# Patient Record
Sex: Female | Born: 1965 | Hispanic: Yes | Marital: Married | Smoking: Never smoker
Health system: Southern US, Community
[De-identification: ages and names within clinical notes are randomized; demographics above are authoritative.]

## PROBLEM LIST (undated history)

## (undated) DIAGNOSIS — E785 Hyperlipidemia, unspecified: Secondary | ICD-10-CM

## (undated) DIAGNOSIS — K5900 Constipation, unspecified: Secondary | ICD-10-CM

## (undated) DIAGNOSIS — N390 Urinary tract infection, site not specified: Secondary | ICD-10-CM

## (undated) DIAGNOSIS — K219 Gastro-esophageal reflux disease without esophagitis: Secondary | ICD-10-CM

## (undated) HISTORY — PX: COLONOSCOPY: SHX174

## (undated) HISTORY — PX: TUBAL LIGATION: SHX77

## (undated) HISTORY — PX: POLYPECTOMY: SHX149

## (undated) HISTORY — DX: Gastro-esophageal reflux disease without esophagitis: K21.9

## (undated) HISTORY — DX: Constipation, unspecified: K59.00

## (undated) HISTORY — DX: Hyperlipidemia, unspecified: E78.5

## (undated) HISTORY — DX: Urinary tract infection, site not specified: N39.0

---

## 2003-03-19 ENCOUNTER — Ambulatory Visit (HOSPITAL_COMMUNITY): Admission: RE | Admit: 2003-03-19 | Discharge: 2003-03-19 | Payer: Self-pay | Admitting: Internal Medicine

## 2005-02-22 ENCOUNTER — Inpatient Hospital Stay (HOSPITAL_COMMUNITY): Admission: AD | Admit: 2005-02-22 | Discharge: 2005-02-22 | Payer: Self-pay | Admitting: *Deleted

## 2005-03-02 ENCOUNTER — Ambulatory Visit: Payer: Self-pay | Admitting: Family Medicine

## 2005-03-04 ENCOUNTER — Inpatient Hospital Stay (HOSPITAL_COMMUNITY): Admission: AD | Admit: 2005-03-04 | Discharge: 2005-03-04 | Payer: Self-pay | Admitting: Obstetrics and Gynecology

## 2005-03-04 ENCOUNTER — Ambulatory Visit: Payer: Self-pay | Admitting: Obstetrics and Gynecology

## 2005-03-07 ENCOUNTER — Ambulatory Visit: Payer: Self-pay | Admitting: *Deleted

## 2005-03-15 ENCOUNTER — Ambulatory Visit: Payer: Self-pay | Admitting: Obstetrics & Gynecology

## 2005-04-11 ENCOUNTER — Ambulatory Visit: Payer: Self-pay | Admitting: *Deleted

## 2005-05-09 ENCOUNTER — Ambulatory Visit: Payer: Self-pay | Admitting: *Deleted

## 2005-05-16 ENCOUNTER — Ambulatory Visit: Payer: Self-pay | Admitting: *Deleted

## 2005-05-28 ENCOUNTER — Ambulatory Visit: Payer: Self-pay | Admitting: Certified Nurse Midwife

## 2005-05-28 ENCOUNTER — Inpatient Hospital Stay (HOSPITAL_COMMUNITY): Admission: AD | Admit: 2005-05-28 | Discharge: 2005-05-31 | Payer: Self-pay | Admitting: Family Medicine

## 2005-11-03 ENCOUNTER — Emergency Department (HOSPITAL_COMMUNITY): Admission: EM | Admit: 2005-11-03 | Discharge: 2005-11-03 | Payer: Self-pay | Admitting: Emergency Medicine

## 2006-02-14 ENCOUNTER — Ambulatory Visit: Payer: Self-pay | Admitting: Obstetrics & Gynecology

## 2006-02-28 ENCOUNTER — Ambulatory Visit (HOSPITAL_COMMUNITY): Admission: RE | Admit: 2006-02-28 | Discharge: 2006-02-28 | Payer: Self-pay | Admitting: *Deleted

## 2006-03-14 ENCOUNTER — Ambulatory Visit: Payer: Self-pay | Admitting: Obstetrics & Gynecology

## 2006-04-04 ENCOUNTER — Ambulatory Visit: Payer: Self-pay | Admitting: Obstetrics & Gynecology

## 2006-05-02 ENCOUNTER — Ambulatory Visit: Payer: Self-pay | Admitting: Obstetrics & Gynecology

## 2006-05-02 ENCOUNTER — Ambulatory Visit (HOSPITAL_COMMUNITY): Admission: RE | Admit: 2006-05-02 | Discharge: 2006-05-02 | Payer: Self-pay | Admitting: Family Medicine

## 2006-05-26 ENCOUNTER — Inpatient Hospital Stay (HOSPITAL_COMMUNITY): Admission: AD | Admit: 2006-05-26 | Discharge: 2006-05-28 | Payer: Self-pay | Admitting: Family Medicine

## 2006-05-26 ENCOUNTER — Ambulatory Visit: Payer: Self-pay | Admitting: Obstetrics & Gynecology

## 2006-05-28 ENCOUNTER — Encounter (INDEPENDENT_AMBULATORY_CARE_PROVIDER_SITE_OTHER): Payer: Self-pay | Admitting: *Deleted

## 2006-10-16 HISTORY — PX: TUBAL LIGATION: SHX77

## 2007-08-20 IMAGING — US US OB TRANSVAGINAL MODIFY
1 series · 14 of 28 positions shown · non-contrast
Comparison: none

CLINICAL DATA: Vomiting.  Patient with recent tubal ligation.  Positive pregnancy test.
 OBSTETRICAL ULTRASOUND <14 WKS AND TRANSVAGINAL OB US:
TECHNIQUE: Both transabdominal and transvaginal ultrasound examinations were performed for complete evaluation of the gestation as well as the maternal uterus, adnexal regions, and pelvic cul-de-sac.

[Series 1: unknown · 0.32mm/px · 14 of 36 slices shown]
[im 2/36]
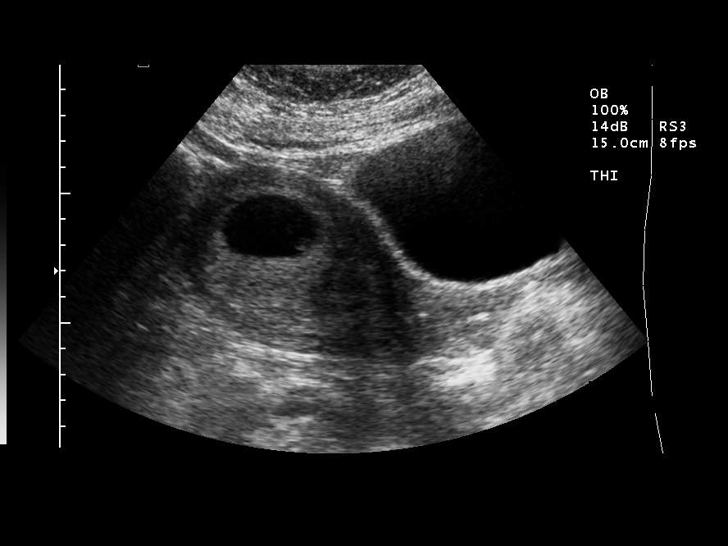
[im 4/36]
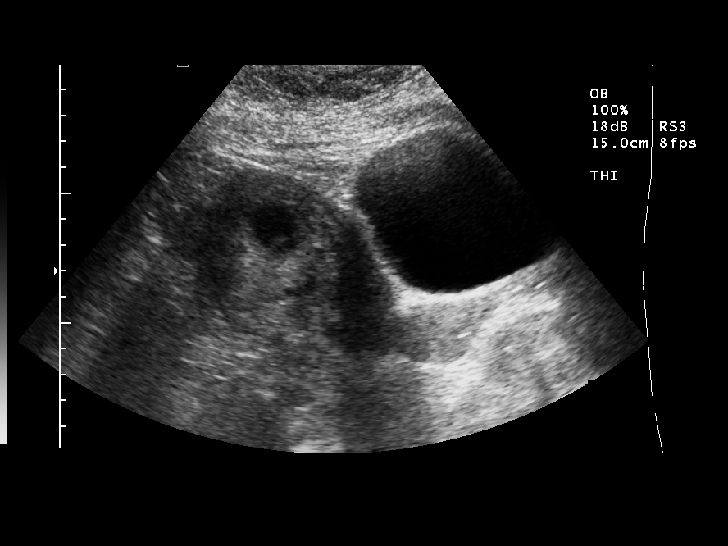
[im 7/36]
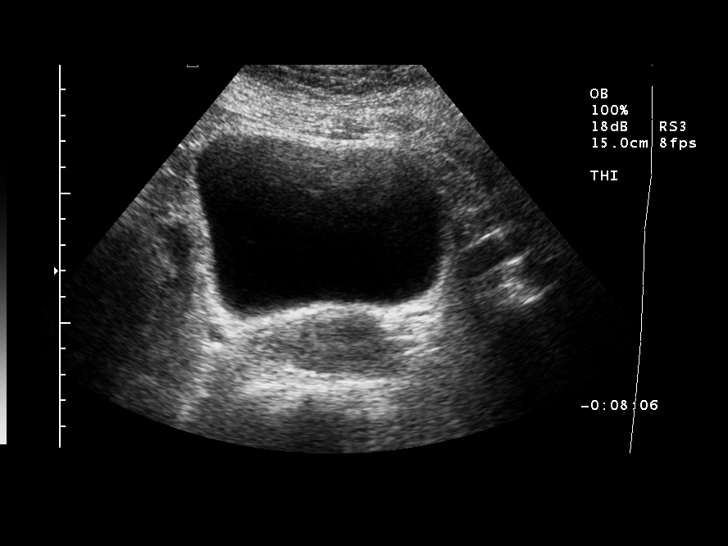
[im 10/36]
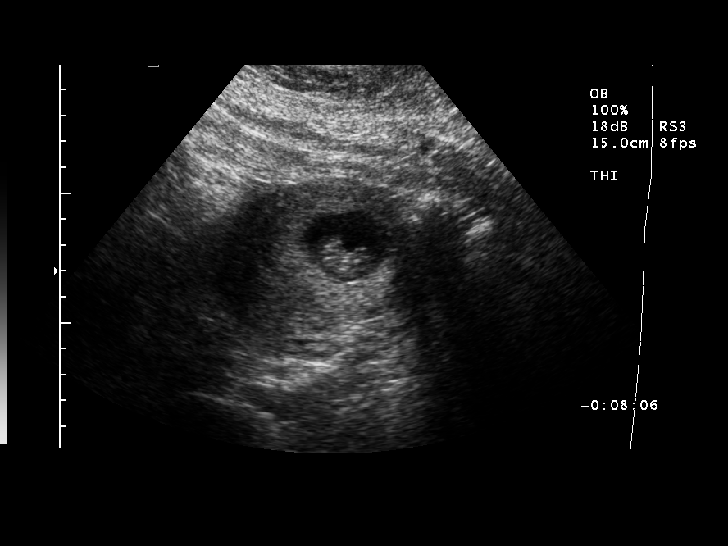
[im 12/36]
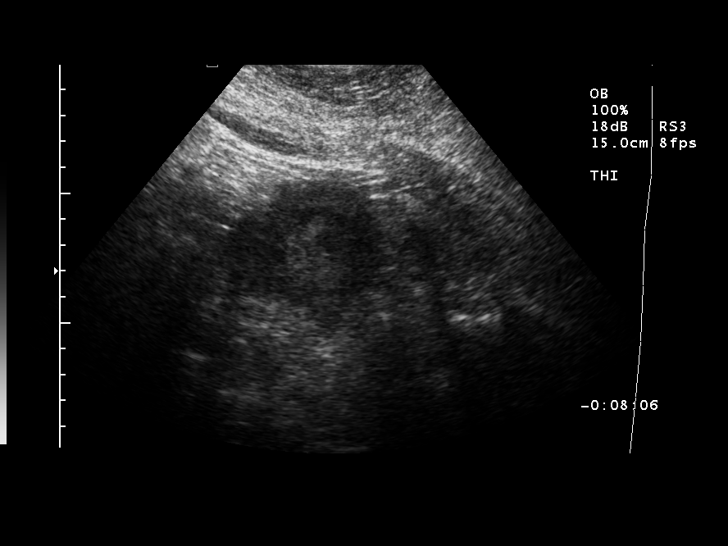
[im 15/36]
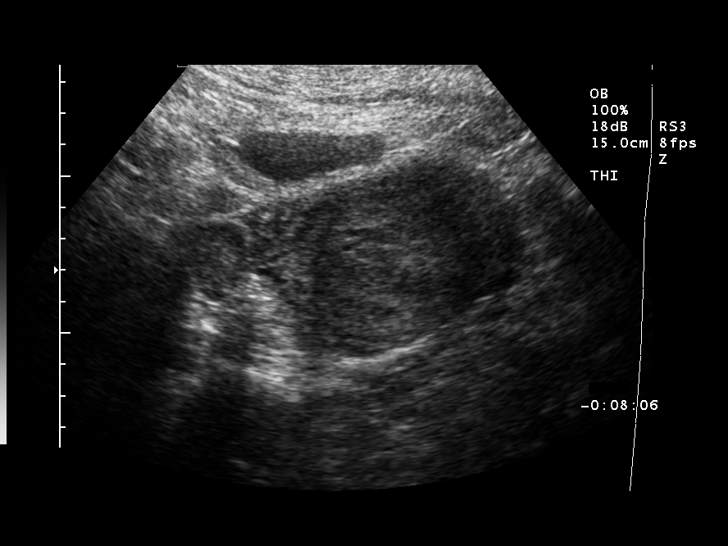
[im 17/36]
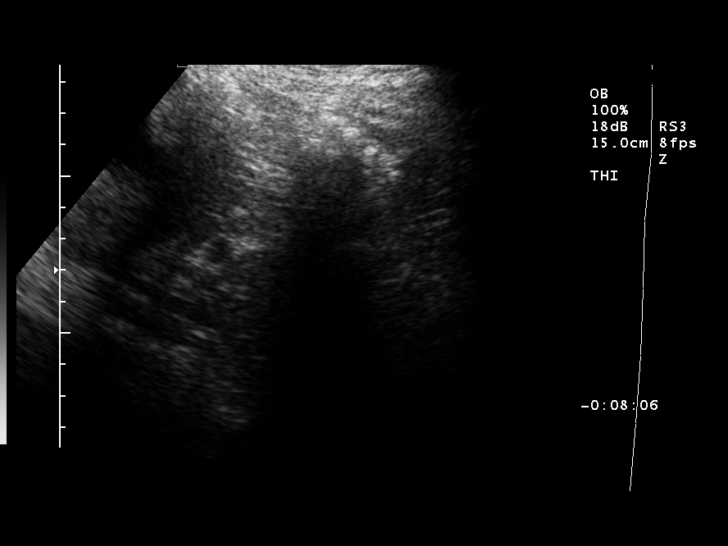
[im 20/36]
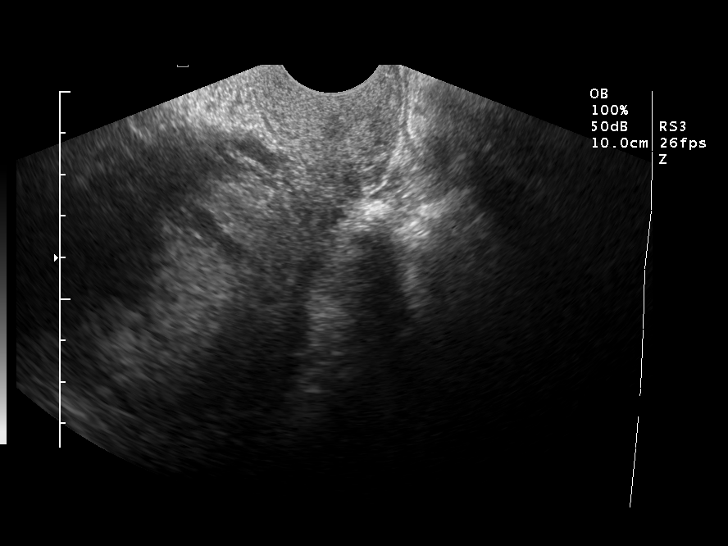
[im 23/36]
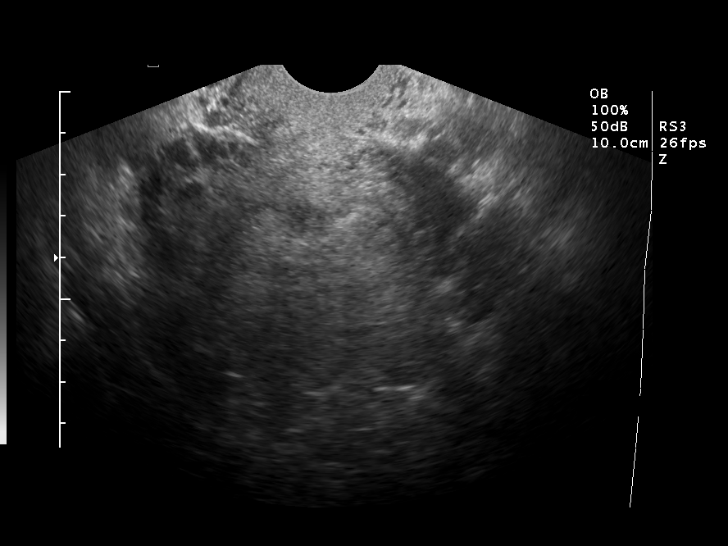
[im 25/36]
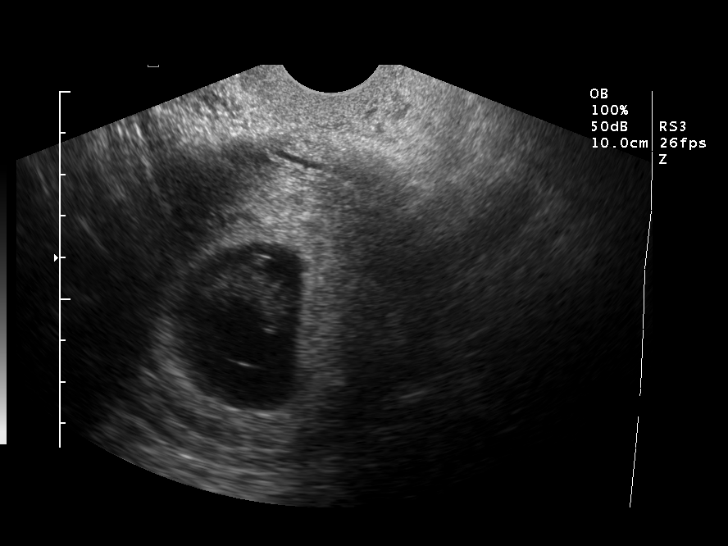
[im 28/36]
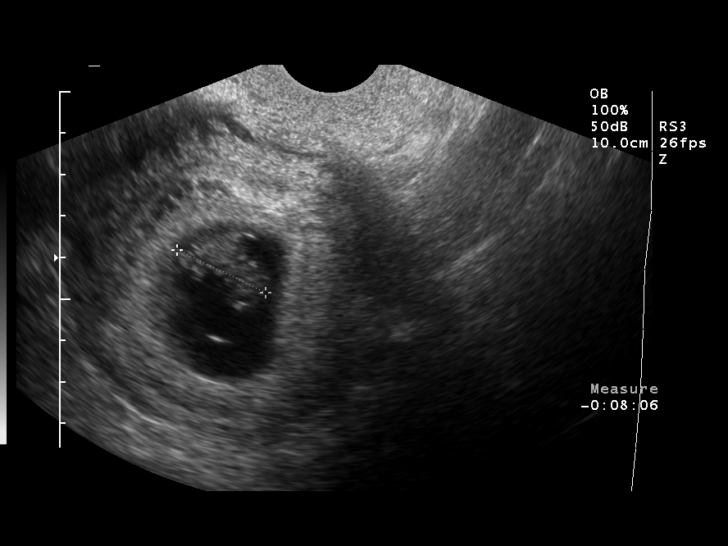
[im 30/36]
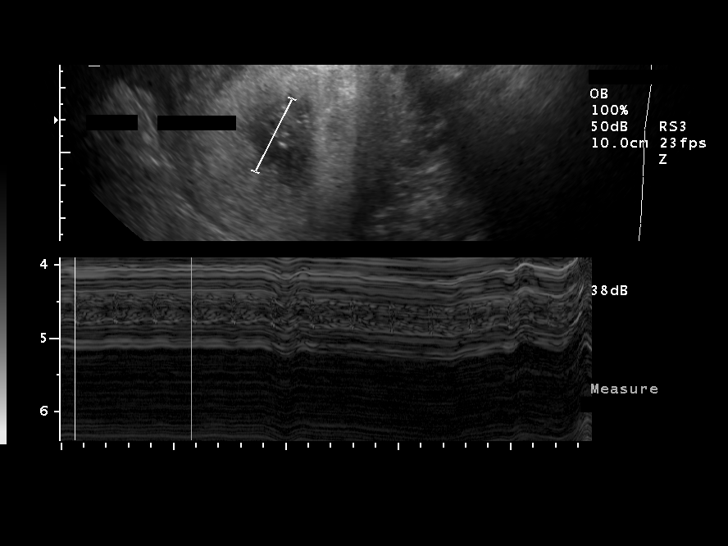
[im 33/36]
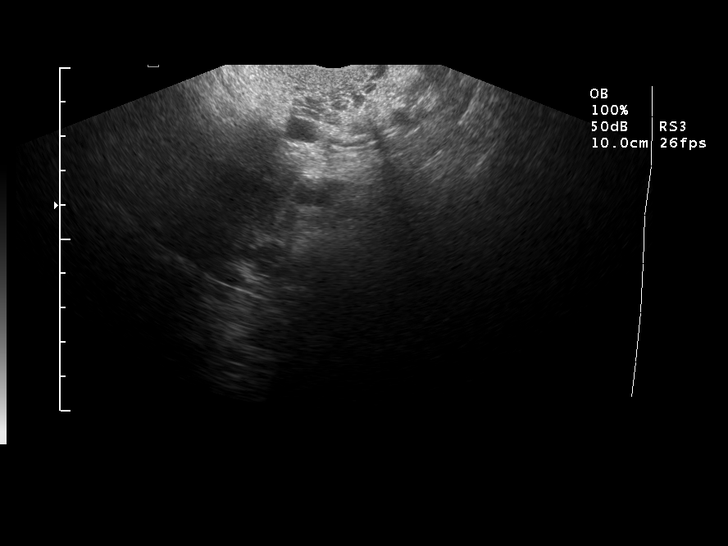
[im 36/36]
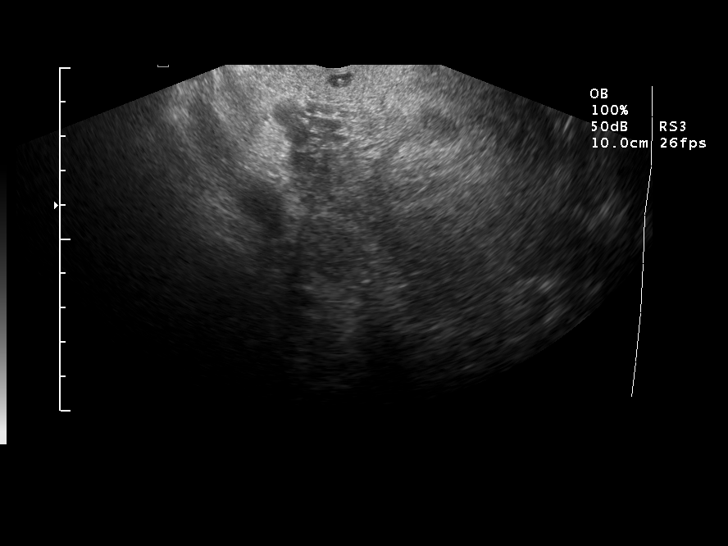

[14 of 28 positions shown; findings below may reference images not displayed]

FINDINGS: Intrauterine gestational sac is identified.  An amnion is apparent although the yolk sac is not well seen on the provided images.  Crown rump length is 23.6 mm which estimates a 9 week 0 day gestational age. Embryonic heart rate is measured at 174 bpm.  
 Neither ovary is well-visualized.  There is no free fluid identified in the cul-de-sac.
IMPRESSION: Single living intrauterine gestation at 9 weeks 0 days gestational age by crown rump length.

## 2007-12-15 IMAGING — US US OB DETAIL+14 WK
1 series · 13 of 28 positions shown · non-contrast
Comparison: none

CLINICAL DATA: Evaluate anatomy.  Advanced maternal age.

[Series 1: us ob detail+14 wk · 0.43mm/px · 13 of 95 slices shown]
[im 4/95]
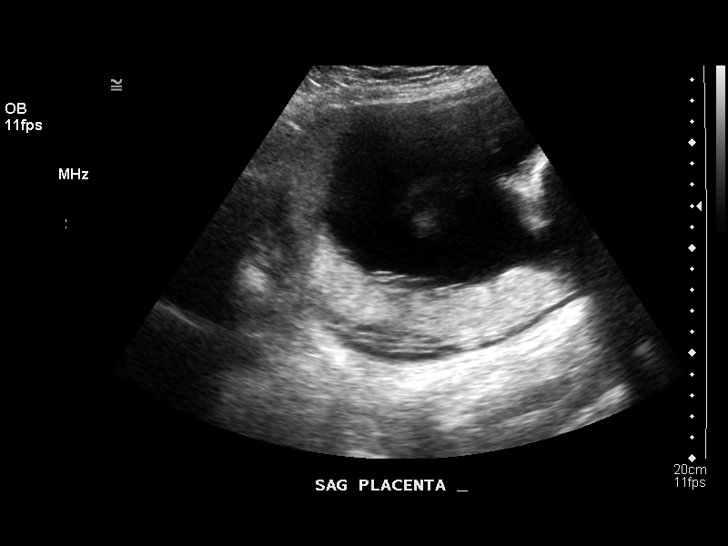
[im 11/95]
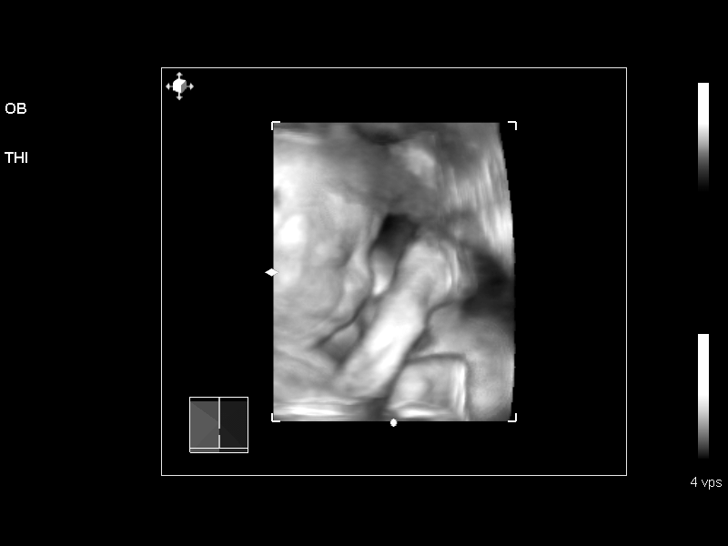
[im 18/95]
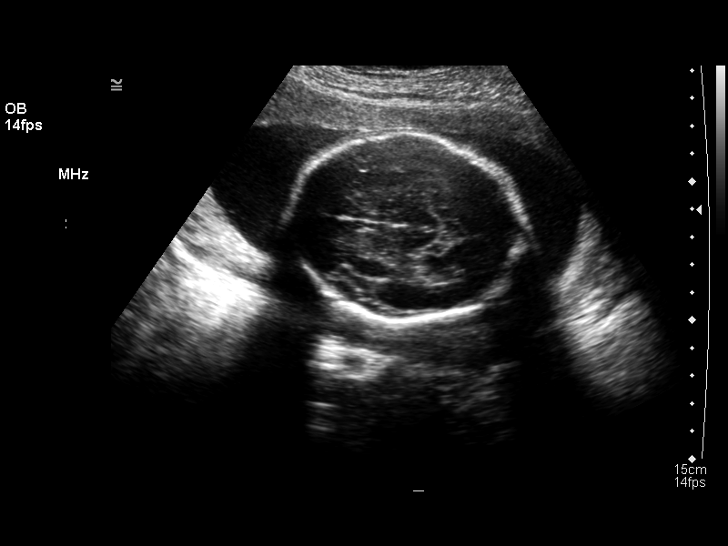
[im 25/95]
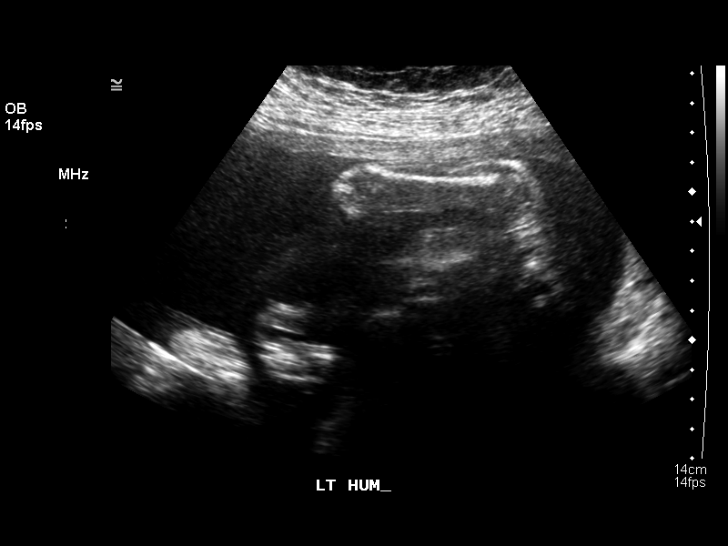
[im 32/95]
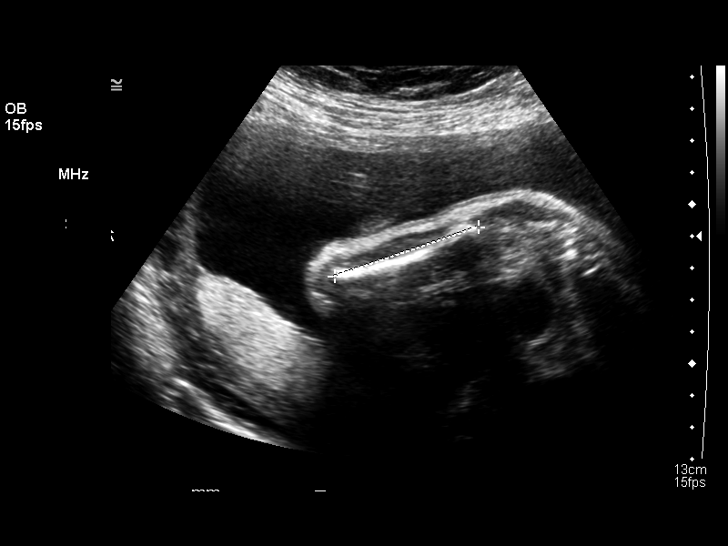
[im 39/95]
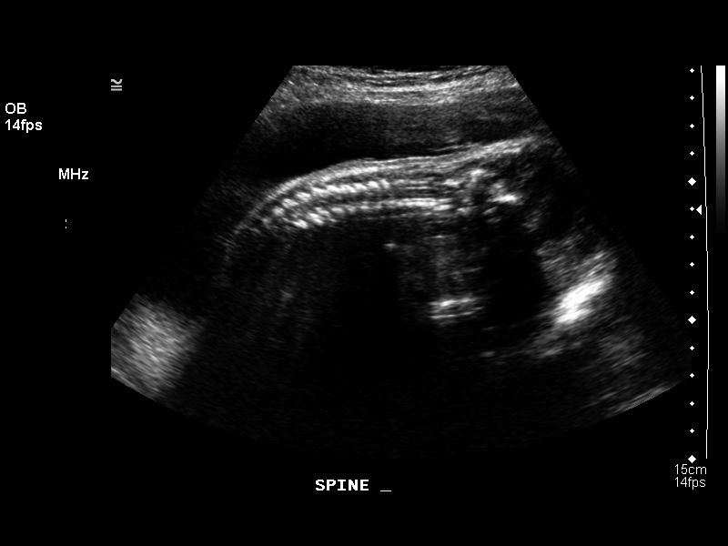
[im 49/95]
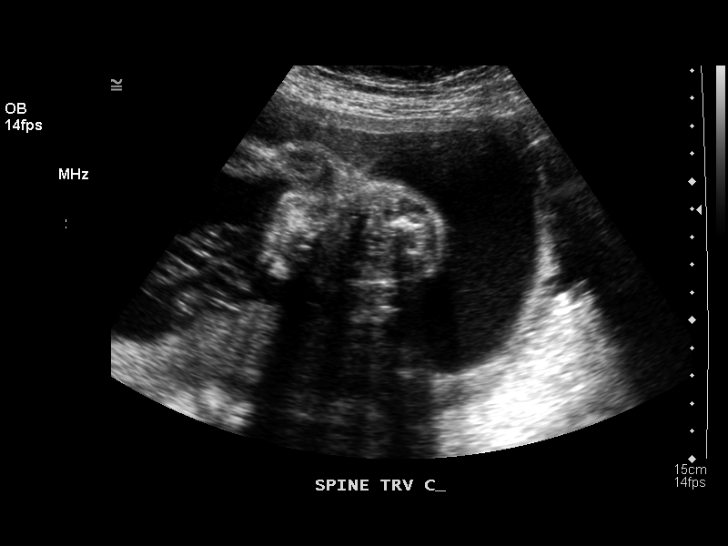
[im 56/95]
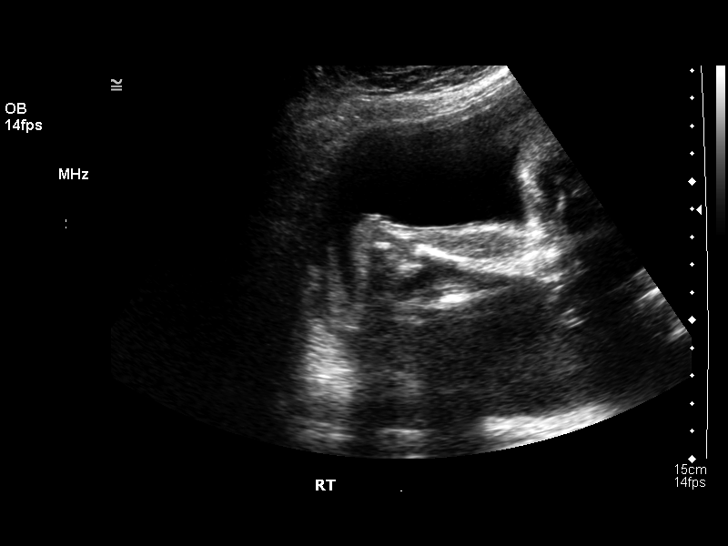
[im 63/95]
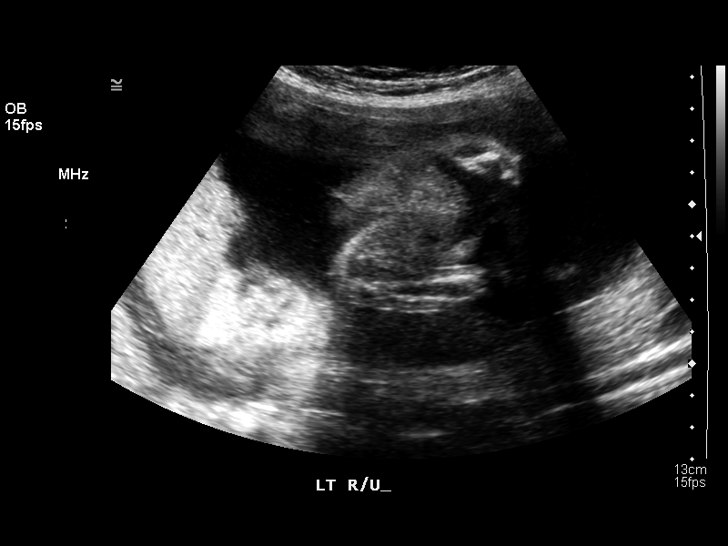
[im 70/95]
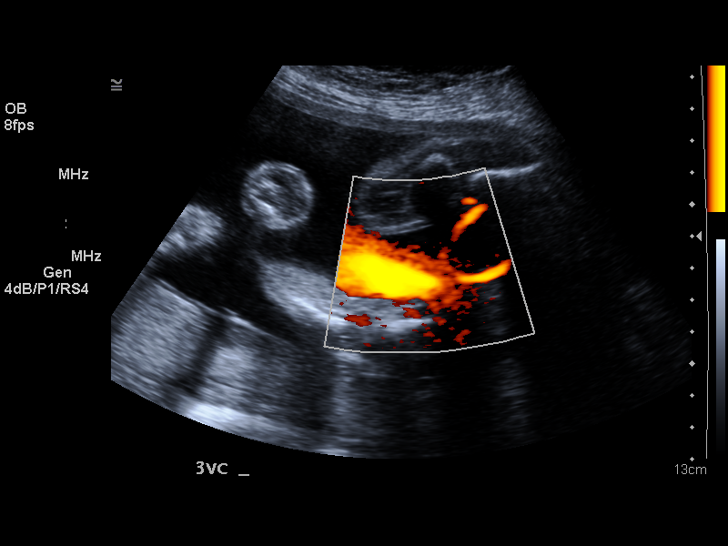
[im 77/95]
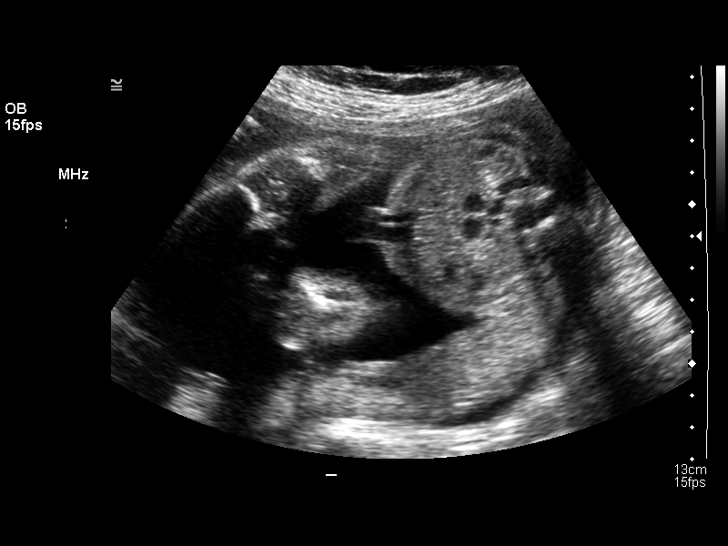
[im 84/95]
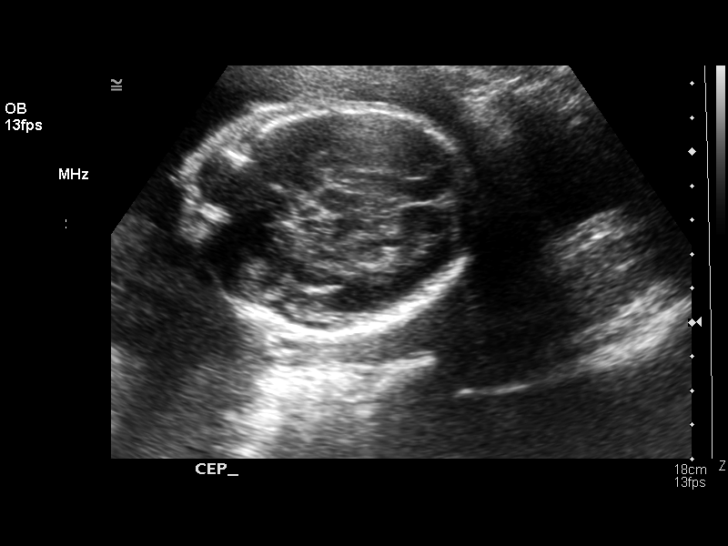
[im 91/95]
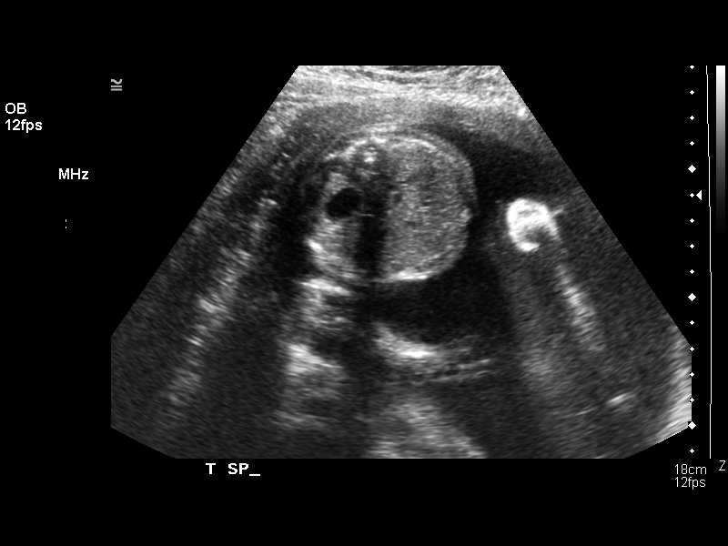

[13 of 28 positions shown; findings below may reference images not displayed]

DETAILED OBSTETRICAL ULTRASOUND:

 Number of Fetuses: 1
 Heart Rate:  141
 Movement:  Yes
 Breathing:  No
 Presentation:  Cephalic
 Placental Location:  Posterior, right lateral 
 Grade:  I
 Previa:  No
 Amniotic Fluid (Subjective):  Increased
 Amniotic Fluid (Objective):  22.7 cm AFI (5th -95th%ile = 9.7 – 22.3 cm for 26 wks)

 FETAL BIOMETRY
 BPD:  6.8 cm   27 w 1 d
 HC:  25.0 cm  27 w 1 d
 AC:  22.2 cm   26 w 4 d
 FL:   4.8 cm   26 w 1 d
 HL:   4.5 cm   26 w 4 d

 MEAN GA:  26 w 5 d  US EDC:  06/01/06
 Assigned GA:  26 w 2 d  Assigned EDC:  06/04/06
 EFW:  947 g (H)

 FETAL ANATOMY
 Lateral Ventricles:  Visualized 
 Thalami/CSP:  Visualized 
 Posterior Fossa:  Visualized 
 Nuchal Region:  N/A
 Spine:  Visualized 
 4 Chamber Heart on Left:  Not visualized 
 Stomach on Left:  Visualized 
 3 Vessel Cord:  Visualized 
 Cord Insertion Site:  Visualized 
 Kidneys:  Visualized 
 Bladder:  Visualized 
 Extremities:  Visualized 

 ADDITIONAL ANATOMY VISUALIZED:  LVOT, upper lip, orbits, profile, diaphragm, heel, 5th digit, ductal arch, aortic arch, and male genitalia.

 MATERNAL UTERINE AND ADNEXAL FINDINGS
 Cervix: 3.7 cm Transabdominally
IMPRESSION: 1.  Single living intrauterine fetus in cephalic presentation with subjectively increased amniotic fluid volume.  The AFI is 22.7 cm which is at the 95th percentile for a 26 week gestation.  
 2.  Estimated mean gestational age by ultrasound today is 26 weeks 5 days which is 3 days ahead of the reported assigned gestational age.  There is good concordance of the fetal biometric parameters. 
 3.  Visualized fetal anatomy is unremarkable although an adequate four chamber view of the heart and RVOT could not be obtained despite concerted effort.  Correlation with maternal triple screen results recommended.  Follow-up ultrasound in 2 to 3 weeks may be helpful to reassess cardiac anatomy as warranted.

## 2008-02-16 IMAGING — US US OB FOLLOW-UP
1 series · 13 of 28 positions shown · non-contrast
Comparison: none

CLINICAL DATA: Follow-up cardiac anatomy.  Initial ultrasound exam was performed at approximately 26 weeks.  The patient now returns at 35 weeks.

[Series 1: us ob follow-up · 0.35mm/px · 13 of 35 slices shown]
[im 2/35]
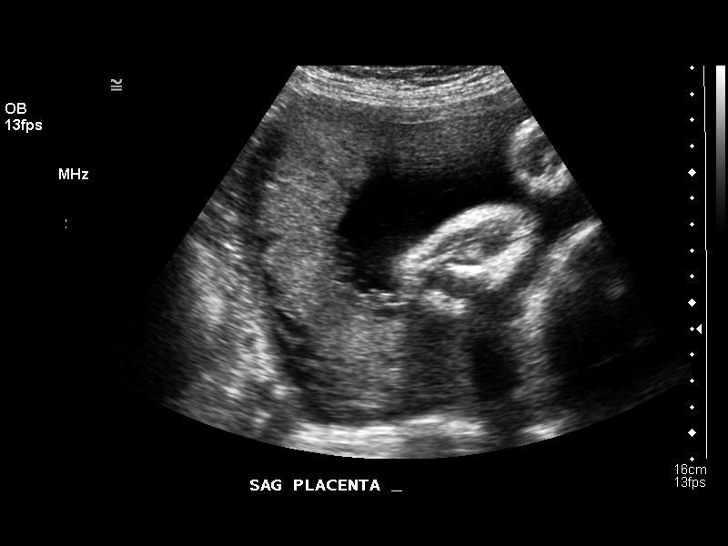
[im 4/35]
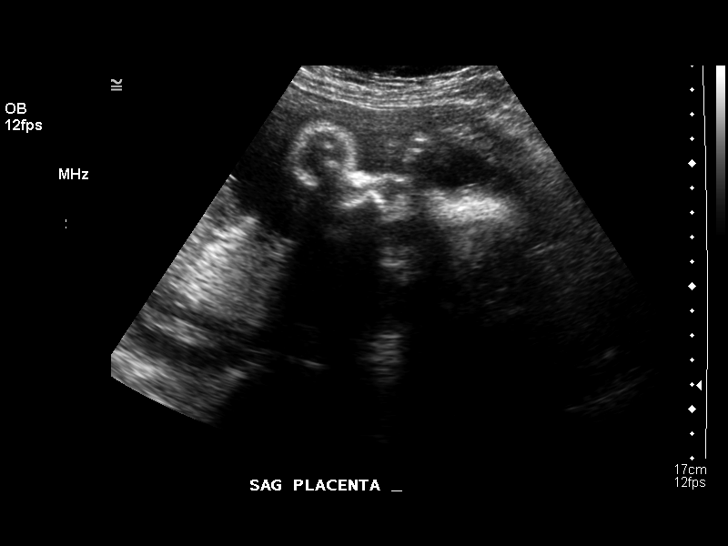
[im 7/35]
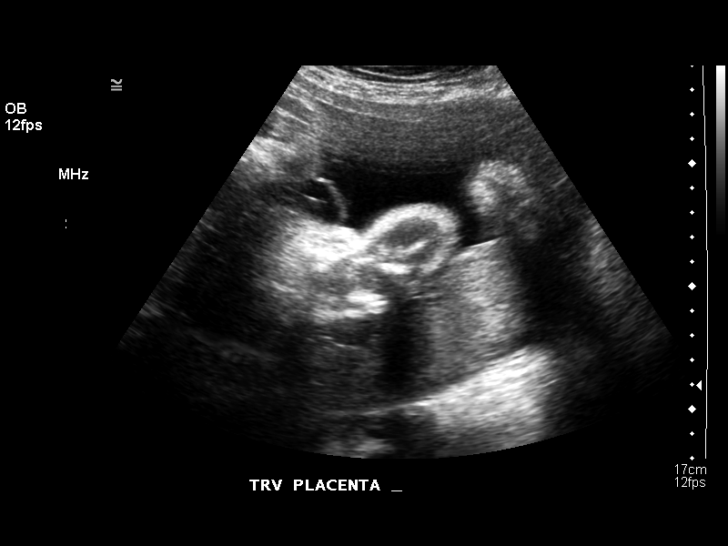
[im 9/35]
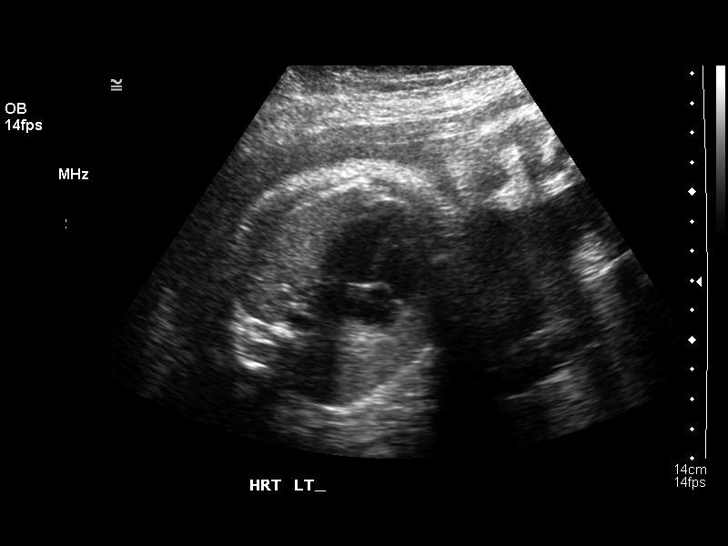
[im 12/35]
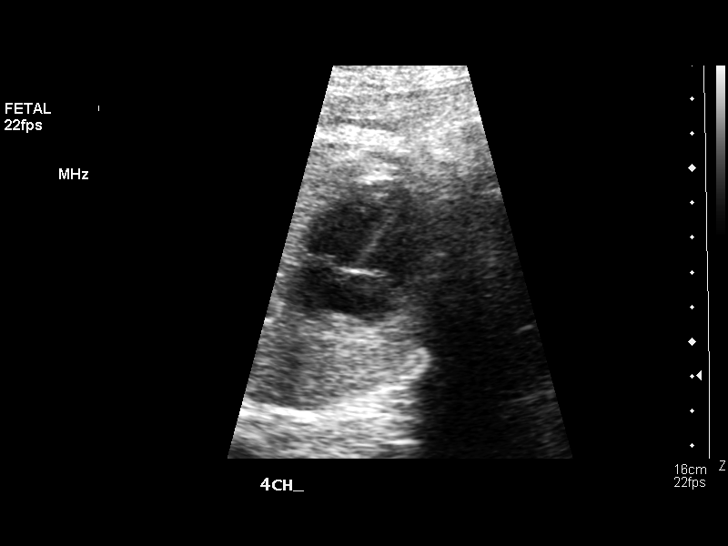
[im 14/35]
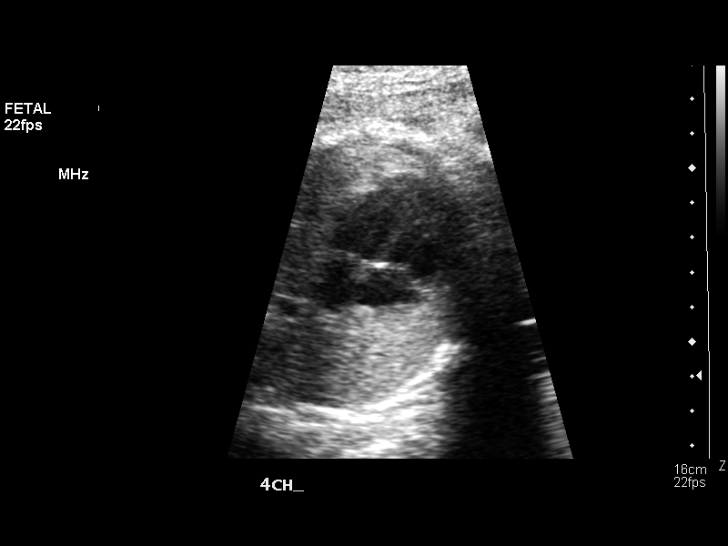
[im 18/35]
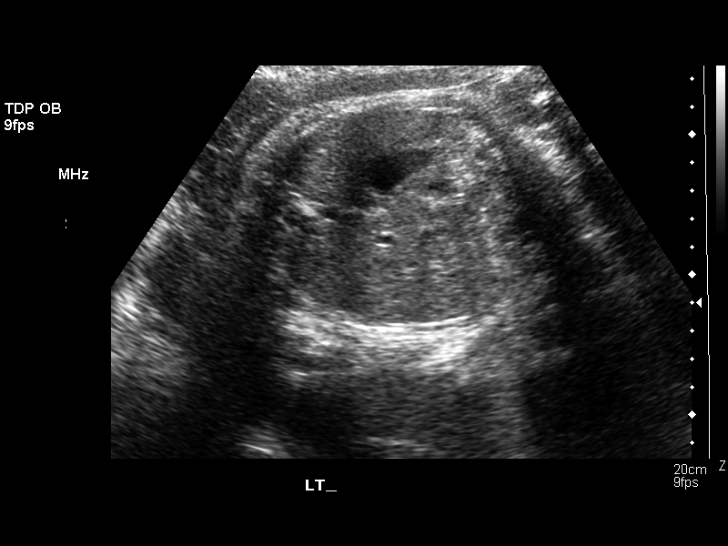
[im 21/35]
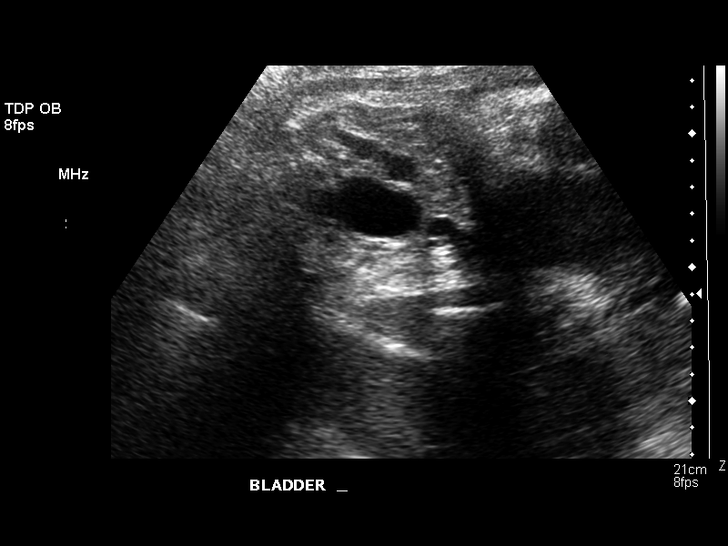
[im 23/35]
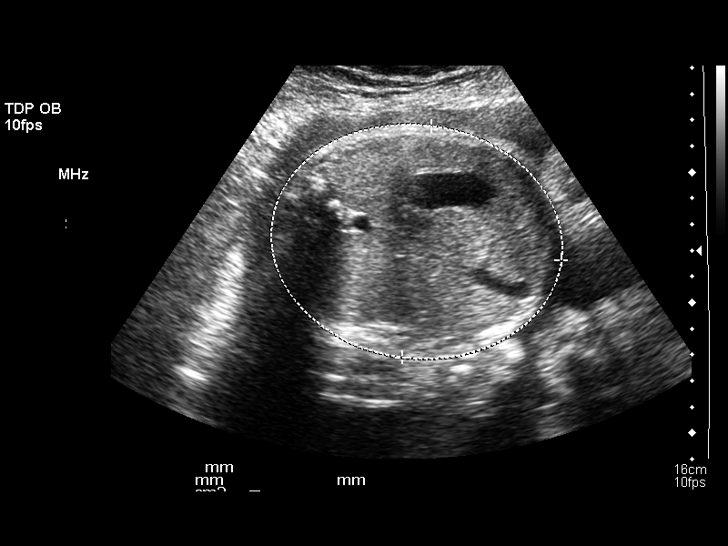
[im 26/35]
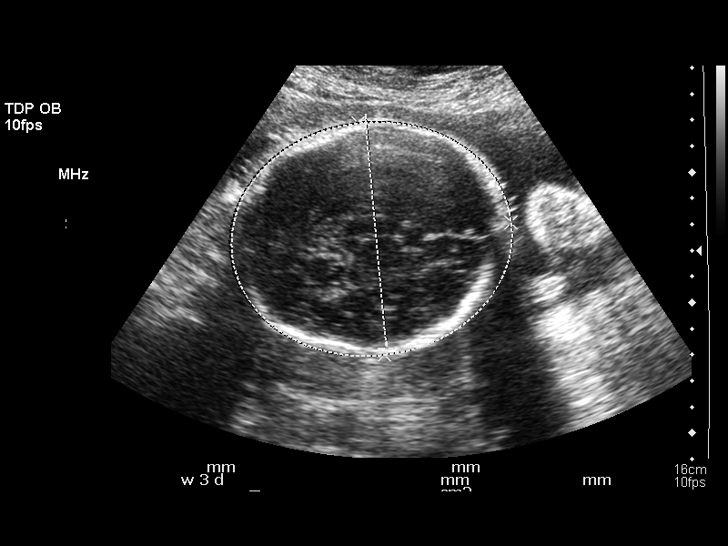
[im 28/35]
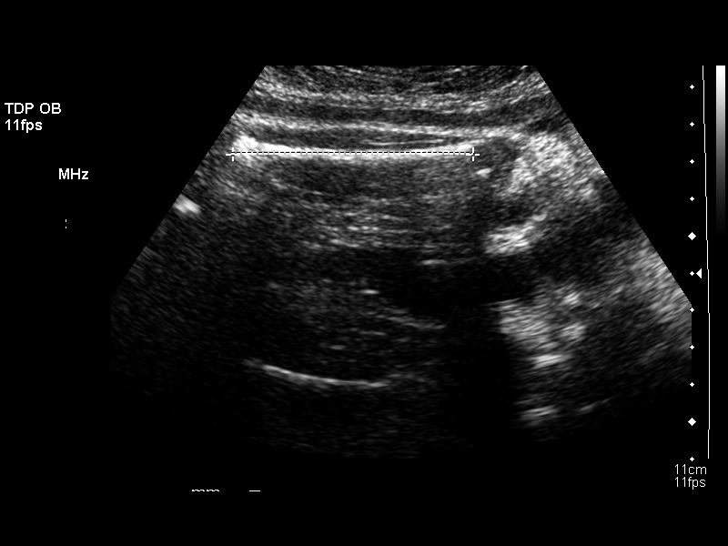
[im 31/35]
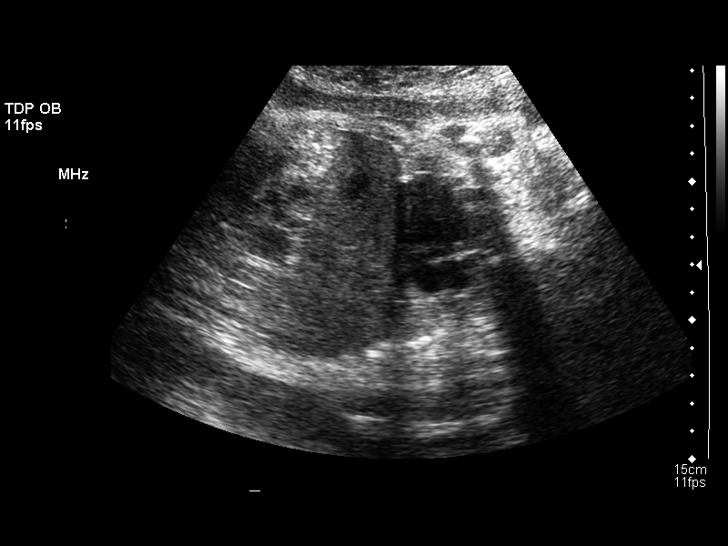
[im 33/35]
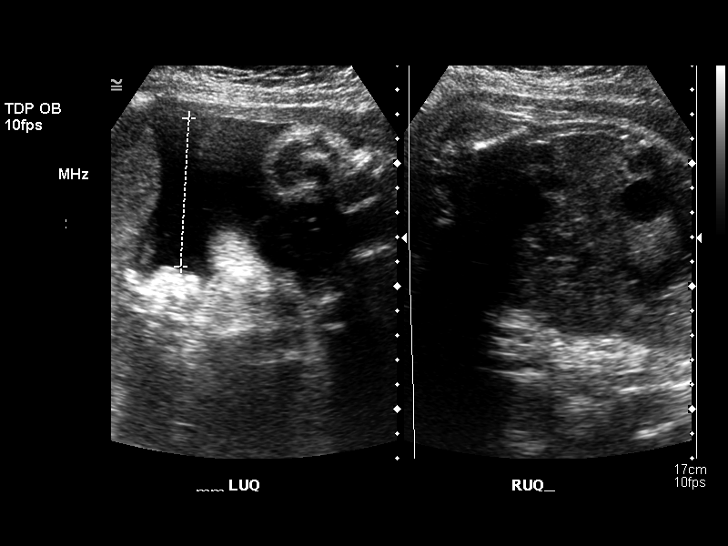

[13 of 28 positions shown; findings below may reference images not displayed]

OBSTETRICAL ULTRASOUND RE-EVALUATION:
 Number of Fetuses: 1
 Heart Rate:  141
 Movement:  Yes
 Breathing:  Yes
 Presentation:  Cephalic
 Placental Location:  Posterior, right lateral 
 Grade:  I
 Previa:  No
 Amniotic Fluid (subjective):  Normal
 Amniotic Fluid (objective):  13.2 cm AFI (5th -95th%ile = 7.9 – 24.9 cm for 35 wks)

 FETAL BIOMETRY
 BPD:  8.6 cm   34 w 6 d
 HC:  31.4 cm   35 w 2 d
 AC:   32.2 cm   36 w 0 d 
 FL:   6.4 cm   33 w 1 d

 Mean GA:  34 w 6 d  US EDC:  06/07/06
 Assigned GA:  35 w 2 d  Assigned EDC:  06/04/06

 EFW:  3878 g (H) 50th – 75th%ile (7353 – 3008 g) For 35 wks

 FETAL ANATOMY
 Lateral Ventricles:  Visualized 
 Thalami/CSP:  Previously seen 
 Posterior Fossa:  Previously seen 
 Nuchal Region:  Not visualized 
 Spine:  Previously seen 
 4 Chamber Heart on Left:  Visualized 
 Stomach on Left:  Visualized 
 3 Vessel Cord:  Previously seen 
 Cord Insertion Site:  Previously seen 
 Kidneys:  Visualized 
 Bladder:  Visualized 
 Extremities:  Previously seen 

 MATERNAL UTERINE AND ADNEXAL FINDINGS
 Cervix:  Not evaluated.
IMPRESSION: 1.  Single living intrauterine fetus in cephalic presentation with subjectively and quantitatively normal amniotic fluid volume.  Estimated mean gestational age by ultrasound today is 34 weeks 6 days which is within one week of the reported assigned gestational age.  
 2.  Assessment of fetal anatomy is limited by the advanced gestational age.  A four chamber view of the heart is identified although the foramen ovale is prominent.  This is a nonspecific finding.  Correlation after delivery with cardiac exam is recommended.

## 2009-09-28 ENCOUNTER — Emergency Department (HOSPITAL_COMMUNITY): Admission: EM | Admit: 2009-09-28 | Discharge: 2009-09-28 | Payer: Self-pay | Admitting: Family Medicine

## 2009-12-07 ENCOUNTER — Ambulatory Visit: Payer: Self-pay | Admitting: Internal Medicine

## 2009-12-28 ENCOUNTER — Encounter (INDEPENDENT_AMBULATORY_CARE_PROVIDER_SITE_OTHER): Payer: Self-pay | Admitting: Family Medicine

## 2009-12-28 ENCOUNTER — Ambulatory Visit: Payer: Self-pay | Admitting: Internal Medicine

## 2009-12-28 LAB — CONVERTED CEMR LAB
AST: 17 units/L (ref 0–37)
Alkaline Phosphatase: 47 units/L (ref 39–117)
Basophils Absolute: 0 10*3/uL (ref 0.0–0.1)
CO2: 26 meq/L (ref 19–32)
Calcium: 9.1 mg/dL (ref 8.4–10.5)
Creatinine, Ser: 0.72 mg/dL (ref 0.40–1.20)
HCT: 45 % (ref 36.0–46.0)
HDL: 58 mg/dL (ref >39)
Hemoglobin: 14.5 g/dL (ref 12.0–15.0)
LDL Cholesterol: 128 mg/dL — ABNORMAL HIGH (ref 0–99)
Lymphs Abs: 2.3 10*3/uL (ref 0.7–4.0)
MCHC: 32.2 g/dL (ref 30.0–36.0)
Neutro Abs: 4 10*3/uL (ref 1.7–7.7)
Platelets: 325 10*3/uL (ref 150–400)
Potassium: 4.3 meq/L (ref 3.5–5.3)
RDW: 14.2 % (ref 11.5–15.5)
Sodium: 138 meq/L (ref 135–145)
TSH: 1.202 microintl units/mL (ref 0.350–4.500)
Triglycerides: 112 mg/dL (ref <150)
WBC: 6.8 10*3/uL (ref 4.0–10.5)

## 2010-01-04 ENCOUNTER — Ambulatory Visit: Payer: Self-pay | Admitting: Internal Medicine

## 2010-03-07 ENCOUNTER — Ambulatory Visit: Payer: Self-pay | Admitting: Internal Medicine

## 2010-03-07 ENCOUNTER — Encounter (INDEPENDENT_AMBULATORY_CARE_PROVIDER_SITE_OTHER): Payer: Self-pay | Admitting: Family Medicine

## 2010-03-07 LAB — CONVERTED CEMR LAB: Prolactin: 8.1 ng/mL

## 2010-03-10 ENCOUNTER — Encounter: Admission: RE | Admit: 2010-03-10 | Discharge: 2010-03-10 | Payer: Self-pay | Admitting: Internal Medicine

## 2010-08-10 ENCOUNTER — Emergency Department (HOSPITAL_COMMUNITY): Admission: EM | Admit: 2010-08-10 | Discharge: 2010-08-10 | Payer: Self-pay | Admitting: Emergency Medicine

## 2010-11-06 ENCOUNTER — Encounter: Payer: Self-pay | Admitting: *Deleted

## 2010-12-28 LAB — POCT URINALYSIS DIPSTICK
Bilirubin Urine: NEGATIVE
Glucose, UA: NEGATIVE mg/dL
Ketones, ur: NEGATIVE mg/dL
Nitrite: NEGATIVE
pH: 5.5 (ref 5.0–8.0)

## 2011-03-03 NOTE — Op Note (Signed)
[name], [name]    ACCOUNT NO.:  [id number]   MEDICAL RECORD NO.:  [id number]          PATIENT TYPE:  INP   LOCATION:  9109                          [facility]ACILITY:  [facility]   PHYSICIAN:  Lesly Dukes, M.Isabel. DATE OF BIRTH:  16-May-1966   DATE OF PROCEDURE:  05/28/2006  DATE OF DISCHARGE:  05/28/2006                                 OPERATIVE REPORT   PREOPERATIVE DIAGNOSIS:  [age]-year-old multiparous patient desiring  permanent sterilization.   POSTOPERATIVE DIAGNOSIS:  [age]-year-old multiparous patient desiring  permanent sterilization.   PROCEDURE:  Postpartum bilateral tubal ligation via the Pomeroy method.   SURGEON:  Lesly Dukes, M.Isabel.   ANESTHESIA:  Spinal.   SPECIMENS:  Bilateral portions of fallopian tubes.   ESTIMATED BLOOD LOSS:  Minimal.   COMPLICATIONS:  None.   FINDINGS:  Filshie clip completely across left fallopian tube; the right  side had the Filshie clip mostly on the broad ligament and partially  covering the round ligament.   DESCRIPTION OF PROCEDURE:  Consent was obtained.  The patient was taken to  the operating room, where spinal anesthesia was found to be adequate.  The  patient was placed in the dorsal lithotomy position and prepared and draped  in normal sterile fashion.  The bladder was emptied with a catheter.  An  infraumbilical skin incision was made with a scalpel and the peritoneum was  entered via open method.  Retractor was placed into the abdomen.  The  patient was placed in Trendelenburg position.  Each fallopian tube was  grasped with a Babcock clamp and followed out to its fimbriated end with the  above findings noted.  The middle portion of the fallopian tube was tented  up with the Babcock clamp and tied off with plain suture and transected.  Good hemostasis was noted.  This was done on both sides.  Good hemostasis  was noted from each fallopian tube before it was returned to the abdomen.  The fascia was closed with 0  Vicryl in running fashion.  The subcu tissue  was noted to be hemostatic.  The subcutaneous tissue was closed partially  with 4-0 Vicryl in subcuticular fashion and partially with Dermabond  __________  portion of the umbilicus.  The patient tolerated the procedure  well.  Sponge, lap, instrument and needle counts were correct x2.  The  patient went to Recovery in stable condition.           ______________________________  Lesly Dukes, M.Isabel.     KHL/MEDQ  Isabel:  05/28/2006  T:  05/29/2006  Job:  [id number]

## 2011-03-03 NOTE — Op Note (Signed)
Isabel Deleon, Isabel Deleon    ACCOUNT NO.:  [id number]   MEDICAL RECORD NO.:  [id number]         PATIENT TYPE:  iINP   LOCATION:  [facility]                            [facility]   PHYSICIAN:  Lesly Dukes, M.D. DATE OF BIRTH:  [birth year]   DATE OF PROCEDURE:  05/29/2005  DATE OF DISCHARGE:                                 OPERATIVE REPORT   PREOPERATIVE DIAGNOSIS:  Desire for permanent sterilization, multiparity.   POSTOPERATIVE DIAGNOSIS:  Desire for permanent sterilization, multiparity.   OPERATION/PROCEDURE:  Open bilateral tubal ligation with Filshie clips.   SURGEON:  Lesly Dukes, M.D.   ASSISTANT:  Marc Morgans. Mayford Knife, M.D.   ANESTHESIA:  Spinal.   COMPLICATIONS:  None.   ESTIMATED BLOOD LOSS:  Minimal.   IV FLUIDS:  One liter of lactated Ringer's.   URINARY OUTPUT:  Clear urine.   FINDINGS:  Normal uterus and ovaries.  Essential normal tubes with mild  adhesions on the right.   DESCRIPTION OF PROCEDURE:  The patient was taken to the operating room where  spinal was placed.  She was placed in the dorsal lithotomy position and  prepped and draped in the sterile fashion.  Attention was turned to the  patient's abdomen where a 3 cm vertical incision was made in the umbilicus.  It was carried down into the peritoneal cavity.  One lap was used for  packing.  The left fallopian tube was identified and followed to the fimbria  using a Babcock.  Two Babcocks were placed in the middle portion of the  fallopian tube and the Filshie clip was placed between them.  Attention was  then turned to right side where in a similar fashion, Babcocks were used to  follow the fallopian tube out of the fimbria, and two Babcocks were placed  in the middle of the fallopian tube.  A Filshie clip  was placed between the two Babcocks.  The lap was removed.  There was  excellent hemostasis and the anatomy was as above.  Fascia was closed with 0  Vicryl.  The skin was reapproximated and  Dermabond was used.  Sponge, lap  and needle counts were correct x2.  The patient was taken to the recovery  room in stable condition.     ______________________________  Marc Morgans Mayford Knife, M.D.    ______________________________  Lesly Dukes, M.D.    TLW/MEDQ  D:  05/29/2005  T:  05/30/2005  Job:  [id number]

## 2011-03-16 ENCOUNTER — Inpatient Hospital Stay (INDEPENDENT_AMBULATORY_CARE_PROVIDER_SITE_OTHER)
Admission: RE | Admit: 2011-03-16 | Discharge: 2011-03-16 | Disposition: A | Discharge status: Home / Self Care | Payer: Self-pay | Source: Ambulatory Visit | Attending: Family Medicine | Admitting: Family Medicine

## 2011-03-16 DIAGNOSIS — R300 Dysuria: Secondary | ICD-10-CM

## 2011-03-16 DIAGNOSIS — H01009 Unspecified blepharitis unspecified eye, unspecified eyelid: Secondary | ICD-10-CM

## 2011-03-16 LAB — POCT URINALYSIS DIP (DEVICE)
Glucose, UA: NEGATIVE mg/dL
Ketones, ur: NEGATIVE mg/dL
Urobilinogen, UA: 1 mg/dL (ref 0.0–1.0)

## 2011-03-16 LAB — POCT PREGNANCY, URINE: Preg Test, Ur: NEGATIVE

## 2011-03-17 LAB — URINE CULTURE: Culture  Setup Time: 201205311950

## 2011-06-02 ENCOUNTER — Other Ambulatory Visit (HOSPITAL_COMMUNITY): Payer: Self-pay | Admitting: Family Medicine

## 2011-06-02 ENCOUNTER — Ambulatory Visit (HOSPITAL_COMMUNITY)
Admission: RE | Admit: 2011-06-02 | Discharge: 2011-06-02 | Disposition: A | Discharge status: Home / Self Care | Payer: Self-pay | Source: Ambulatory Visit | Attending: Family Medicine | Admitting: Family Medicine

## 2011-06-02 DIAGNOSIS — R52 Pain, unspecified: Secondary | ICD-10-CM

## 2011-06-02 DIAGNOSIS — R109 Unspecified abdominal pain: Secondary | ICD-10-CM | POA: Insufficient documentation

## 2011-06-02 DIAGNOSIS — K5900 Constipation, unspecified: Secondary | ICD-10-CM | POA: Insufficient documentation

## 2011-07-24 ENCOUNTER — Other Ambulatory Visit: Payer: Self-pay | Admitting: Obstetrics and Gynecology

## 2011-07-24 DIAGNOSIS — Z1231 Encounter for screening mammogram for malignant neoplasm of breast: Secondary | ICD-10-CM

## 2011-07-25 ENCOUNTER — Encounter: Payer: Self-pay | Admitting: *Deleted

## 2011-07-25 ENCOUNTER — Ambulatory Visit (INDEPENDENT_AMBULATORY_CARE_PROVIDER_SITE_OTHER): Payer: Self-pay | Admitting: *Deleted

## 2011-07-25 ENCOUNTER — Ambulatory Visit (HOSPITAL_COMMUNITY)
Admission: RE | Admit: 2011-07-25 | Discharge: 2011-07-25 | Disposition: A | Discharge status: Home / Self Care | Payer: Self-pay | Source: Ambulatory Visit | Attending: Obstetrics and Gynecology | Admitting: Obstetrics and Gynecology

## 2011-07-25 ENCOUNTER — Encounter (HOSPITAL_COMMUNITY): Payer: Self-pay

## 2011-07-25 VITALS — BP 112/81 | HR 63 | Temp 97.4°F | Ht 59.75 in | Wt 190.8 lb

## 2011-07-25 DIAGNOSIS — Z1231 Encounter for screening mammogram for malignant neoplasm of breast: Secondary | ICD-10-CM

## 2011-07-25 DIAGNOSIS — Z01419 Encounter for gynecological examination (general) (routine) without abnormal findings: Secondary | ICD-10-CM

## 2011-07-25 NOTE — Progress Notes (Signed)
No complaints today.  Pap Smear:    Pap smear Completed today. Patient couldn't remember when last Pap smear was completed. Per patient no history of abnormal Pap smears. In EPIC noticed last Pap smear was 03/07/10. Went ahead and did Pap smear since patient was unsure of last Pap smear date.   Physical exam: Breasts      Breasts symmetrical. No skin abnormalities. Left nipple inverted. Per patient that is normal for her and always been that way. Patient stated she was unable to breast feed from her breast due to the nipple being inverted. Right nipple normal. No lymphadenopathy. No nipple discharge. No lumps palpated bilateral breasts.      Pelvic/Bimanual   Ext Genitalia     No lesions and no swelling. No discharge observed on external genitalia.     Vagina      Vagina pink and no lesions observed. Vagina normal texture. No discharge observed in vagina.     Cervix      Cervix Present. Cervix pink and of normal texture.    Uterus      Uterus present and palpable. Uterus normal position and size.    Adnexae      Ovaries present. Ovaries not palpable and no tenderness on palpation.    Rectovaginal      No rectal exam completed. No skin abnormalties observed.

## 2011-07-25 NOTE — Progress Notes (Signed)
Used interpreter Ruta Hinds.

## 2011-07-25 NOTE — Patient Instructions (Signed)
Taught patient how to do BSE. Told patient to got upstairs for mammogram following appointment. Patient verbalized understanding.

## 2011-08-07 ENCOUNTER — Telehealth: Payer: Self-pay | Admitting: *Deleted

## 2011-08-07 NOTE — Telephone Encounter (Signed)
Received voicemail, left message with return phone number.  Will attempt to phone back at a later date re: pap smear and mammogram results.

## 2011-08-23 ENCOUNTER — Encounter: Payer: Self-pay | Admitting: Obstetrics and Gynecology

## 2012-06-07 ENCOUNTER — Ambulatory Visit (INDEPENDENT_AMBULATORY_CARE_PROVIDER_SITE_OTHER): Payer: BC Managed Care – PPO | Admitting: Internal Medicine

## 2012-06-07 VITALS — BP 116/74 | HR 63 | Temp 97.6°F | Resp 16 | Ht 61.0 in | Wt 195.0 lb

## 2012-06-07 DIAGNOSIS — N390 Urinary tract infection, site not specified: Secondary | ICD-10-CM

## 2012-06-07 DIAGNOSIS — R300 Dysuria: Secondary | ICD-10-CM

## 2012-06-07 LAB — POCT URINALYSIS DIPSTICK
Bilirubin, UA: NEGATIVE
Blood, UA: NEGATIVE
Glucose, UA: NEGATIVE
Ketones, UA: NEGATIVE
Nitrite, UA: NEGATIVE
Protein, UA: NEGATIVE
Spec Grav, UA: >=1.03
Urobilinogen, UA: 0.2
pH, UA: 5.5

## 2012-06-07 LAB — POCT UA - MICROSCOPIC ONLY
Casts, Ur, LPF, POC: NEGATIVE
Crystals, Ur, HPF, POC: NEGATIVE
Mucus, UA: POSITIVE
Yeast, UA: NEGATIVE

## 2012-06-07 MED ORDER — CIPROFLOXACIN HCL 500 MG PO TABS: 500.0000 mg | ORAL_TABLET | Freq: Two times a day (BID) | ORAL | Status: AC

## 2012-06-07 NOTE — Progress Notes (Signed)
  Subjective:    Patient ID: Isabel Deleon, female    DOB: November 11, 1965, 46 y.o.   MRN: [id number]  HPI1st ov here Complaining of dysuria and frequency especially during the night and early morning for 3 weeks No fever abdominal pain nausea vomiting diarrhea or flank pain No history of kidney stones Appetite and activity have been okay Last period 3 weeks ago/status post BTL twice    Review of Systems     Objective:   Physical Exam Vital signs stable Abdomen supple/no CVA tenderness       Results for orders placed in visit on 06/07/12  POCT UA - MICROSCOPIC ONLY      Component Value Range   WBC, Ur, HPF, POC 8-10     RBC, urine, microscopic 3-6     Bacteria, U Microscopic 3+     Mucus, UA pos     Epithelial cells, urine per micros 6-8     Crystals, Ur, HPF, POC neg     Casts, Ur, LPF, POC neg     Yeast, UA neg    POCT URINALYSIS DIPSTICK      Component Value Range   Color, UA yellow     Clarity, UA clear     Glucose, UA neg     Bilirubin, UA neg     Ketones, UA neg     Spec Grav, UA >=1.030     Blood, UA neg     pH, UA 5.5     Protein, UA neg     Urobilinogen, UA 0.2     Nitrite, UA neg     Leukocytes, UA Trace      Assessment & Plan:  Problem #1 dysuria probable UTI Culture Cipro 500 twice a day for 10 days

## 2012-06-09 LAB — URINE CULTURE

## 2013-05-31 ENCOUNTER — Ambulatory Visit: Payer: BC Managed Care – PPO | Admitting: Family Medicine

## 2013-10-07 ENCOUNTER — Other Ambulatory Visit: Payer: Self-pay | Admitting: Gastroenterology

## 2016-04-26 ENCOUNTER — Ambulatory Visit (INDEPENDENT_AMBULATORY_CARE_PROVIDER_SITE_OTHER): Payer: BC Managed Care – PPO | Admitting: Urgent Care

## 2016-04-26 ENCOUNTER — Encounter: Payer: Self-pay | Admitting: Urgent Care

## 2016-04-26 VITALS — BP 118/74 | HR 71 | Temp 98.1°F | Resp 16 | Ht 61.0 in | Wt 209.0 lb

## 2016-04-26 DIAGNOSIS — R300 Dysuria: Secondary | ICD-10-CM | POA: Diagnosis not present

## 2016-04-26 DIAGNOSIS — E669 Obesity, unspecified: Secondary | ICD-10-CM | POA: Diagnosis not present

## 2016-04-26 DIAGNOSIS — N3090 Cystitis, unspecified without hematuria: Secondary | ICD-10-CM | POA: Diagnosis not present

## 2016-04-26 DIAGNOSIS — R350 Frequency of micturition: Secondary | ICD-10-CM | POA: Diagnosis not present

## 2016-04-26 LAB — POCT URINALYSIS DIP (MANUAL ENTRY)
Bilirubin, UA: NEGATIVE
Glucose, UA: NEGATIVE
Ketones, POC UA: NEGATIVE
Leukocytes, UA: NEGATIVE
Nitrite, UA: NEGATIVE
PH UA: 6
PROTEIN UA: NEGATIVE
RBC UA: NEGATIVE
SPEC GRAV UA: 1.02
UROBILINOGEN UA: 0.2

## 2016-04-26 LAB — POC MICROSCOPIC URINALYSIS (UMFC)

## 2016-04-26 LAB — HEMOGLOBIN A1C
Hgb A1c MFr Bld: 5.9 % — ABNORMAL HIGH (ref <5.7)
Mean Plasma Glucose: 123 mg/dL

## 2016-04-26 MED ORDER — CIPROFLOXACIN HCL 500 MG PO TABS: 500.0000 mg | ORAL_TABLET | Freq: Two times a day (BID) | ORAL | Status: DC

## 2016-04-26 MED ORDER — PHENAZOPYRIDINE HCL 95 MG PO TABS: 95.0000 mg | ORAL_TABLET | Freq: Three times a day (TID) | ORAL | Status: DC | PRN

## 2016-04-26 NOTE — Patient Instructions (Addendum)
Infeccin urinaria  (Urinary Tract Infection)  La infeccin urinaria puede ocurrir en Clinical cytogeneticist del tracto urinario. El tracto urinario es un sistema de drenaje del cuerpo por el que se eliminan los desechos y el exceso de [place]. El tracto urinario est formado por dos riones, dos urteres, la vejiga y Geologist, engineering. Los riones son rganos que tienen forma de frijol. Cada rin tiene aproximadamente el tamao del puo. Estn situados debajo de las [place], uno a cada lado de la columna vertebral CAUSAS  La causa de la infeccin son los microbios, que son organismos microscpicos, que incluyen hongos, virus, y bacterias. Estos organismos son tan pequeos que slo pueden verse a travs del microscopio. Las bacterias son los microorganismos que ms comnmente causan infecciones urinarias.  SNTOMAS  Los sntomas pueden variar segn la edad y el sexo del paciente y por la ubicacin de la infeccin. Los sntomas en las mujeres jvenes incluyen la necesidad frecuente e intensa de orinar y una sensacin dolorosa de ardor en la vejiga o en la uretra durante la miccin. Las mujeres y los hombres mayores podrn sentir cansancio, temblores y debilidad y Arts development officer musculares y Social research officer, government abdominal. Si tiene [place], puede significar que la infeccin est en los riones. Otros sntomas son dolor en la espalda o en los lados debajo de las [place], nuseas y vmitos.  DIAGNSTICO  Para diagnosticar una infeccin urinaria, el mdico le preguntar acerca de sus sntomas. [employer] una [place] de [place]. La muestra de orina se analiza para Hydrographic surveyor bacterias y glbulos blancos de Herbalist. Los glbulos blancos se forman en el organismo para ayudar a Radio broadcast assistant las infecciones.  TRATAMIENTO  Por lo general, las infecciones urinarias pueden tratarse con medicamentos. Debido a que la [employer] de las infecciones son causadas por bacterias, por lo general pueden tratarse con antibiticos. La eleccin del  antibitico y la duracin del tratamiento depender de sus sntomas y el tipo de bacteria causante de la infeccin.  INSTRUCCIONES PARA EL CUIDADO EN EL HOGAR   Si le recetaron antibiticos, tmelos exactamente como su mdico le indique. Termine el medicamento aunque se sienta mejor despus de haber tomado slo algunos.  Beba gran cantidad de lquido para mantener la orina de tono claro o color amarillo plido.  Evite la cafena, el t y las bebidas gaseosas. Estas sustancias irritan la vejiga.  Vaciar la vejiga con frecuencia. Evite retener la orina durante largos perodos.  Vace la vejiga antes y despus de Clinical biochemist.  Despus de mover el intestino, las mujeres deben higienizarse la regin perineal desde adelante hacia atrs. Use slo un papel tissue por vez. SOLICITE ATENCIN MDICA SI:   Siente dolor en la espalda.  Le sube la fiebre.  Los sntomas no mejoran luego de 3 das. SOLICITE ATENCIN MDICA DE INMEDIATO SI:   Siente dolor intenso en la espalda o en la zona inferior del abdomen.  Comienza a sentir escalofros.  Tiene nuseas o vmitos.  Tiene una sensacin continua de quemazn o molestias al [employer]. ASEGRESE DE QUE:   Comprende estas instrucciones.  Controlar su enfermedad.  Solicitar ayuda de inmediato si no mejora o empeora.   Esta informacin no tiene Marine scientist el consejo del mdico. Asegrese de hacerle al mdico cualquier pregunta que tenga.   Document Released: 07/12/2005 Document Revised: 06/26/2012 Elsevier Interactive Patient Education 2016 [place] (Obesity) Se considera obesidad cuando hay demasiada grasa corporal y el ndice de masa corporal [facility]) es de  30 o ms. El [facility] es la estimacin de la grasa corporal y se calcula a partir de la altura y [place]. Generalmente, es el mdico el que calcula el ndice de masa corporal durante las visitas de control habituales. La obesidad se produce cuando se  consumen ms caloras que las que se gastan realizando ejercicios o actividad fsica [employer]. Cuando la obesidad es [place], puede causar enfermedades graves o Multimedia programmer, como:  Ictus.  Cardiopata.  Diabetes.  Cncer.  Artritis.  Presin arterial elevada (hipertensin arterial).  Colesterol elevado.  Apnea del sueo.  Disfuncin erctil.  Problemas de infertilidad. CAUSAS   Comer habitualmente alimentos poco saludables.  Falta de actividad fsica.  Ciertos trastornos, por ejemplo, menor actividad de la tiroides (hipotiroidismo), sndrome de [place] y sndrome de ovario poliqustico.  Ciertos medicamentos, como los corticoides, algunos antidepresivos y antipsicticos.  Factores genticos.  Falta de sueo. DIAGNSTICO El mdico podr diagnosticar obesidad despus de calcular el IMC. La obesidad se diagnostica cuando el IMC es de 30 o ms. Hay otros mtodos para [employer] de obesidad. Otros mtodos consisten en medir el grosor de un pliegue de piel o la circunferencia de la cintura, y comparar la circunferencia de la cadera con la circunferencia de la cintura. TRATAMIENTO  Un programa de tratamiento saludable incluye todos o algunos de los siguientes pasos:  Cambios en la alimentacin a Barrister's clerk.  Ejercicios y [place] fsica.  Cambios en la conducta y en el estilo de vida.  Medicamentos (solo con la supervisin de su mdico). Los medicamentos pueden ser tiles, pero solamente si se usan junto con programas de dieta y ejercicio. Si el ndice de masa corporal es 40 o ms, el mdico puede recomendar que se someta a una ciruga especializada o que participe en programas que lo ayuden a Horticulturist, commercial. Un programa poco saludable incluye:  Ayunar.  Dietas de moda.  Suplementos y frmacos. Estas elecciones no son exitosas para un control del peso a Barrister's clerk. INSTRUCCIONES PARA EL CUIDADO EN EL HOGAR  Haga ejercicio y [place] fsica segn las  indicaciones de su mdico. Para aumentar la actividad fsica, trate lo siguiente:  Utilice las Clinical cytogeneticist del Materials engineer.  Estacione lejos de la entrada de las tiendas.  Arregle el jardn, ande en bicicleta o camine en vez de mirar televisin o usar la computadora.  Consuma alimentos y bebidas saludables, bajos en caloras, de manera habitual. Coma ms frutas y verduras frescas. Utilice un recetario de alimentos de bajas caloras o tome clases de cocina saludable.  Limite las comidas rpidas, los dulces y los snacks procesados.  Coma porciones ms pequeas.  Lleve un diario de todo lo que come. Hay muchas pginas web gratuitas que podrn ayudarlo. Puede ser til [employer] alimentos de modo que pueda determinar si est consumiendo el tamao adecuado de las porciones.  Evite el consumo alcohol. Beba ms agua y bebidas sin caloras.  Tome vitaminas y suplementos solamente como se lo haya recomendado su mdico.  Tambin pueden ser de gran ayuda los grupos de 19 para perder peso y la orientacin de un nutricionista matriculado, un terapeuta y un programa de educacin para reducir Dealer. SOLICITE ATENCIN MDICA DE INMEDIATO SI:  Siente dolor u opresin en el pecho.  Siente dificultad para respirar o Risk manager.  Siente debilidad o adormecimiento en las piernas.  Se siente confundido o tiene dificultad para hablar.  Tiene cambios repentinos en la visin.   Esta informacin  no tiene Marine scientist el consejo del mdico. Asegrese de hacerle al mdico cualquier pregunta que tenga.   Document Released: 10/02/2005 Document Revised: 10/23/2014 Elsevier Interactive Patient Education [employer].     IF you received an x-ray today, you will receive an invoice from [facility]. Please contact [facility] at [phone] with questions or concerns regarding your invoice.   IF you received labwork today, you will receive an invoice from  [employer]. Please contact [facility] at [phone] with questions or concerns regarding your invoice.   Our billing staff will not be able to assist you with questions regarding bills from these companies.  You will be contacted with the lab results as soon as they are available. The fastest way to get your results is to activate your My Chart account. Instructions are located on the last page of this paperwork. If you have not heard from [place] regarding the results in 2 weeks, please contact this office.     We recommend that you schedule a mammogram for breast cancer screening. Typically, you do not need a referral to do this. Please contact a local imaging center to schedule your mammogram.  [facility] - [phone]  *ask for the Radiology Department [facility] ([place]) - [phone] or [phone]  [facility] - [phone] [place] [phone] [facility] - [phone]  *ask for the [facility] - [phone]  *ask for the Radiology Department [facility] - [phone]  *ask for the [place] - [phone]

## 2016-04-26 NOTE — Progress Notes (Addendum)
    MRN: [id number] DOB: 08/22/1966  Subjective:   Isabel Deleon is a 50 y.o. female presenting for chief complaint of Back Pain  Reports ~1 week history of dysuria, hematuria, urinary frequency, urinary urgency, flank pain and cloudy malordorous urine. Has not tried any medications for relief. Has a history of frequent UTIs, never had a urology consult. Denies fever, abdominal pain, pelvic pain, genital rash, genital irritation and vaginal discharge, nausea and vomiting. Diet is very unhealthy, does not exercise. Admits personal history of gestational diabetes. Denies smoking cigarettes or drinking alcohol.   Isabel Deleon has a current medication list which includes the following prescription(s): multivitamin. Patient has No Known Allergies.  Isabel Deleon  has a past medical history of Hyperlipidemia and UTI (lower urinary tract infection). Also  has past surgical history that includes Tubal ligation.  Her family history includes Hypertension in her mother. Denies family history of diabetes.  Objective:   Vitals: BP 118/74 mmHg  Pulse 71  Temp(Src) 98.1 F (36.7 C) (Oral)  Resp 16  Ht 5\' 1"  (1.549 m)  Wt 209 lb (94.802 kg)  BMI 39.51 kg/m2  SpO2 98%  LMP 04/08/2016  Physical Exam  Constitutional: She is oriented to person, place, and time. She appears well-developed and well-nourished.  HENT:  Mouth/Throat: Oropharynx is clear and moist.  Eyes: No scleral icterus.  Cardiovascular: Normal rate, regular rhythm and intact distal pulses.  Exam reveals no gallop and no friction rub.   No murmur heard. Pulmonary/Chest: No respiratory distress. She has no wheezes. She has no rales.  Abdominal: Soft. Bowel sounds are normal. She exhibits no distension and no mass. There is tenderness (lower mid-left).  No CVA tenderness.  Neurological: She is alert and oriented to person, place, and time.  Skin: Skin is warm and dry.   Results for orders placed or performed in visit on 04/26/16 (from the  past 24 hour(s))  POCT urinalysis dipstick     Status: None   Collection Time: 04/26/16  8:19 AM  Result Value Ref Range   Color, UA yellow yellow   Clarity, UA clear clear   Glucose, UA negative negative   Bilirubin, UA negative negative   Ketones, POC UA negative negative   Spec Grav, UA 1.020    Blood, UA negative negative   pH, UA 6.0    Protein Ur, POC negative negative   Urobilinogen, UA 0.2    Nitrite, UA Negative Negative   Leukocytes, UA Negative Negative  POCT Microscopic Urinalysis ([facility])     Status: Abnormal   Collection Time: 04/26/16  8:19 AM  Result Value Ref Range   WBC,UR,HPF,POC Few (A) None WBC/hpf   RBC,UR,HPF,POC None None RBC/hpf   Bacteria Few (A) None, Too numerous to count   Mucus Present (A) Absent   Epithelial Cells, UR Per Microscopy Many (A) None, Too numerous to count cells/hpf   Assessment and Plan :   1. Cystitis 2. Dysuria 3. Urinary frequency - Will cover for infectious process despite normal urinalysis. However, it does appear that it was not a clean catch. Regardless, she will start ciprofloxacin, referral to urology pending due to frequent UTIs. A1c and urine culture are pending. Rtc in 1 week if no improvement.  4. Obesity - Hemoglobin A1c pending.  Jaynee Eagles, PA-C [facility] [phone] 04/26/2016 8:20 AM

## 2016-04-28 LAB — URINE CULTURE: Colony Count: 10000

## 2016-05-01 ENCOUNTER — Telehealth: Payer: Self-pay

## 2016-05-01 DIAGNOSIS — N390 Urinary tract infection, site not specified: Secondary | ICD-10-CM

## 2016-05-01 NOTE — Telephone Encounter (Signed)
Patien that she was supposed to get a referral possibly to a gynocologist. Patient is calling to get more information.  Patient is spanish speaking. [phone]

## 2016-05-02 NOTE — Telephone Encounter (Signed)
Assessment and Plan :   1. Cystitis 2. Dysuria 3. Urinary frequency - Will cover for infectious process despite normal urinalysis. However, it does appear that it was not a clean catch. Regardless, she will start ciprofloxacin, referral to urology pending due to frequent UTIs. A1c and urine culture are pending. Rtc in 1 week if no improvement.

## 2016-05-04 NOTE — Telephone Encounter (Signed)
SHe would like an appointment for an urologist

## 2016-05-04 NOTE — Telephone Encounter (Signed)
Left message for pt to call back  °

## 2016-06-03 ENCOUNTER — Ambulatory Visit: Payer: BC Managed Care – PPO

## 2016-06-17 ENCOUNTER — Ambulatory Visit (INDEPENDENT_AMBULATORY_CARE_PROVIDER_SITE_OTHER): Payer: BC Managed Care – PPO | Admitting: Family Medicine

## 2016-06-17 VITALS — BP 120/70 | HR 72 | Temp 97.4°F | Resp 18 | Ht 61.0 in | Wt 207.1 lb

## 2016-06-17 DIAGNOSIS — Z124 Encounter for screening for malignant neoplasm of cervix: Secondary | ICD-10-CM | POA: Diagnosis not present

## 2016-06-17 DIAGNOSIS — R350 Frequency of micturition: Secondary | ICD-10-CM

## 2016-06-17 DIAGNOSIS — R102 Pelvic and perineal pain: Secondary | ICD-10-CM | POA: Diagnosis not present

## 2016-06-17 DIAGNOSIS — R300 Dysuria: Secondary | ICD-10-CM

## 2016-06-17 DIAGNOSIS — K5900 Constipation, unspecified: Secondary | ICD-10-CM | POA: Diagnosis not present

## 2016-06-17 DIAGNOSIS — R8271 Bacteriuria: Secondary | ICD-10-CM | POA: Diagnosis not present

## 2016-06-17 DIAGNOSIS — Z113 Encounter for screening for infections with a predominantly sexual mode of transmission: Secondary | ICD-10-CM | POA: Diagnosis not present

## 2016-06-17 LAB — POC MICROSCOPIC URINALYSIS (UMFC)

## 2016-06-17 LAB — POCT URINALYSIS DIP (MANUAL ENTRY)
Bilirubin, UA: NEGATIVE
Glucose, UA: NEGATIVE
Ketones, POC UA: NEGATIVE
Nitrite, UA: NEGATIVE
PROTEIN UA: NEGATIVE
RBC UA: NEGATIVE
SPEC GRAV UA: 1.015
UROBILINOGEN UA: 0.2
pH, UA: 6

## 2016-06-17 LAB — POCT WET + KOH PREP
Trich by wet prep: ABSENT
Yeast by KOH: ABSENT
Yeast by wet prep: ABSENT

## 2016-06-17 MED ORDER — FLUCONAZOLE 150 MG PO TABS: 150.0000 mg | ORAL_TABLET | Freq: Once | ORAL | 0 refills | Status: AC

## 2016-06-17 MED ORDER — NITROFURANTOIN MONOHYD MACRO 100 MG PO CAPS
100.0000 mg | ORAL_CAPSULE | Freq: Two times a day (BID) | ORAL | 0 refills | Status: DC
Start: 2016-06-17 — End: 2016-06-20

## 2016-06-17 NOTE — Patient Instructions (Addendum)
Infeccin urinaria  (Urinary Tract Infection)  La infeccin urinaria puede ocurrir en Clinical cytogeneticist del tracto urinario. El tracto urinario es un sistema de drenaje del cuerpo por el que se eliminan los desechos y el exceso de [place]. El tracto urinario est formado por dos riones, dos urteres, la vejiga y Geologist, engineering. Los riones son rganos que tienen forma de frijol. Cada rin tiene aproximadamente el tamao del puo. Estn situados debajo de las [place], uno a cada lado de la columna vertebral CAUSAS  La causa de la infeccin son los microbios, que son organismos microscpicos, que incluyen hongos, virus, y bacterias. Estos organismos son tan pequeos que slo pueden verse a travs del microscopio. Las bacterias son los microorganismos que ms comnmente causan infecciones urinarias.  SNTOMAS  Los sntomas pueden variar segn la edad y el sexo del paciente y por la ubicacin de la infeccin. Los sntomas en las mujeres jvenes incluyen la necesidad frecuente e intensa de orinar y una sensacin dolorosa de ardor en la vejiga o en la uretra durante la miccin. Las mujeres y los hombres mayores podrn sentir cansancio, temblores y debilidad y Arts development officer musculares y Social research officer, government abdominal. Si tiene [place], puede significar que la infeccin est en los riones. Otros sntomas son dolor en la espalda o en los lados debajo de las [place], nuseas y vmitos.  DIAGNSTICO  Para diagnosticar una infeccin urinaria, el mdico le preguntar acerca de sus sntomas. [employer] una [place] de [place]. La muestra de orina se analiza para Hydrographic surveyor bacterias y glbulos blancos de Herbalist. Los glbulos blancos se forman en el organismo para ayudar a Radio broadcast assistant las infecciones.  TRATAMIENTO  Por lo general, las infecciones urinarias pueden tratarse con medicamentos. Debido a que la [employer] de las infecciones son causadas por bacterias, por lo general pueden tratarse con antibiticos. La eleccin del  antibitico y la duracin del tratamiento depender de sus sntomas y el tipo de bacteria causante de la infeccin.  INSTRUCCIONES PARA EL CUIDADO EN EL HOGAR   Si le recetaron antibiticos, tmelos exactamente como su mdico le indique. Termine el medicamento aunque se sienta mejor despus de haber tomado slo algunos.  Beba gran cantidad de lquido para mantener la orina de tono claro o color amarillo plido.  Evite la cafena, el t y las bebidas gaseosas. Estas sustancias irritan la vejiga.  Vaciar la vejiga con frecuencia. Evite retener la orina durante largos perodos.  Vace la vejiga antes y despus de Clinical biochemist.  Despus de mover el intestino, las mujeres deben higienizarse la regin perineal desde adelante hacia atrs. Use slo un papel tissue por vez. SOLICITE ATENCIN MDICA SI:   Siente dolor en la espalda.  Le sube la fiebre.  Los sntomas no mejoran luego de 3 das. SOLICITE ATENCIN MDICA DE INMEDIATO SI:   Siente dolor intenso en la espalda o en la zona inferior del abdomen.  Comienza a sentir escalofros.  Tiene nuseas o vmitos.  Tiene una sensacin continua de quemazn o molestias al [employer]. ASEGRESE DE QUE:   Comprende estas instrucciones.  Controlar su enfermedad.  Solicitar ayuda de inmediato si no mejora o empeora.   Esta informacin no tiene Marine scientist el consejo del mdico. Asegrese de hacerle al mdico cualquier pregunta que tenga.   Document Released: 07/12/2005 Document Revised: 06/26/2012 Elsevier Interactive Patient Education [employer].     IF you received an x-ray today, you will receive an invoice from [facility]. Please contact [facility]  at [phone] with questions or concerns regarding your invoice.   IF you received labwork today, you will receive an invoice from [employer]. Please contact [facility] at [phone] with questions or concerns  regarding your invoice.   Our billing staff will not be able to assist you with questions regarding bills from these companies.  You will be contacted with the lab results as soon as they are available. The fastest way to get your results is to activate your My Chart account. Instructions are located on the last page of this paperwork. If you have not heard from [place] regarding the results in 2 weeks, please contact this office.    Estreimiento - Adultos (Constipation, Adult) Estreimiento significa que una persona tiene menos de tres evacuaciones en una semana, dificultad para defecar, o que las heces son secas, duras, o ms grandes que lo normal. A medida que envejecemos el estreimiento es ms comn. Una dieta baja en fibra, no tomar suficientes lquidos y el uso de ciertos medicamentos pueden Agricultural engineer.  CAUSAS   Ciertos medicamentos, como los antidepresivos, analgsicos, suplementos de hierro, anticidos y diurticos.  Algunas enfermedades, como la diabetes, el sndrome del colon irritable, enfermedad de la tiroides, o depresin.  No beber suficiente agua.  No consumir suficientes alimentos ricos en fibra.  Situaciones de estrs o viajes.  Falta de actividad fsica o de ejercicio.  Ignorar la necesidad sbita de Landscape architect.  Uso en exceso de laxantes. SIGNOS Y SNTOMAS   Defecar menos de tres veces por semana.  Dificultad para defecar.  Tener las heces secas y duras, o ms grandes que las normales.  Sensacin de estar lleno o hinchado.  Dolor en la parte baja del abdomen.  No sentir alivio despus de defecar. DIAGNSTICO  El mdico le har una historia clnica y un examen fsico. Pueden hacerle exmenes adicionales para el estreimiento grave. Estos estudios pueden ser:  Un radiografa con enema de bario para examinar el recto, el colon y, en algunos casos, el intestino delgado.  Una sigmoidoscopia para examinar el colon inferior.  Una colonoscopia para  examinar todo el colon. TRATAMIENTO  El tratamiento depender de la gravedad del estreimiento y de la causa. Algunos tratamientos nutricionales son beber ms lquidos y comer ms alimentos ricos en fibra. El cambio en el estilo de vida incluye hacer ejercicios de [place] regular. Si estas recomendaciones para Animator dieta y en el estilo de vida no ayudan, el mdico le puede indicar el uso de laxantes de venta libre para ayudarlo a Landscape architect. Los medicamentos recetados se pueden prescribir si los medicamentos de venta libre no lo [place].  INSTRUCCIONES PARA EL CUIDADO EN EL HOGAR   Consuma alimentos con alto contenido de [place], como frutas, vegetales, cereales integrales y porotos.  Limite los alimentos procesados ricos en grasas y azcar, como las papas fritas, hamburguesas, galletas, dulces y refrescos.  Puede agregar un suplemento de fibra a su dieta si no obtiene lo suficiente de los alimentos.  Beba suficiente lquido para Consulting civil engineer orina clara o de color amarillo plido.  Haga ejercicio regularmente o segn las indicaciones del mdico.  Vaya al bao cuando sienta la necesidad de ir. No se aguante las ganas.  Tome solo medicamentos de venta libre o recetados, segn las indicaciones del mdico. No tome otros medicamentos para el estreimiento sin consultarlo antes con su mdico. SOLICITE ATENCIN MDICA DE INMEDIATO SI:   Observa sangre brillante en las heces.  El estreimiento dura ms  de 4 das o empeora.  Siente dolor abdominal o rectal.  Las heces son delgadas como un lpiz.  Pierde peso de [place] inexplicable. ASEGRESE DE QUE:   Comprende estas instrucciones.  Controlar su afeccin.  Recibir ayuda de inmediato si no mejora o si empeora.   Esta informacin no tiene Marine scientist el consejo del mdico. Asegrese de hacerle al mdico cualquier pregunta que tenga.   Document Released: 10/22/2007 Document Revised: 10/23/2014 Elsevier Interactive  Patient Education [employer].

## 2016-06-17 NOTE — Progress Notes (Deleted)
    MRN: [id number] DOB: Apr 27, 1966  Subjective:   Isabel Deleon is a 50 y.o. female presenting for chief complaint of Abdominal Pain (x 2 weeks); Back Pain; Urinary Frequency; and Flu Vaccine   Reports *** history of {UTI Symptoms:210800002}, {Systemic Symptoms:[phone]}. Has tried *** relief. Denies {UTI Symptoms:210800002}, {Systemic Symptoms:[phone]}.   Isabel Deleon has a current medication list which includes the following prescription(s): multivitamin. Patient has No Known Allergies.  Isabel Deleon  has a past medical history of Hyperlipidemia and UTI (lower urinary tract infection). Also  has a past surgical history that includes Tubal ligation.  Objective:   Vitals: BP 120/70   Pulse 72   Temp 97.4 F (36.3 C) (Oral)   Resp 18   Ht 5\' 1"  (1.549 m)   Wt 207 lb 2 oz (94 kg)   SpO2 98%   BMI 39.14 kg/m   Physical Exam  Results for orders placed or performed in visit on 06/17/16 (from the past 24 hour(s))  POCT urinalysis dipstick     Status: Abnormal   Collection Time: 06/17/16 10:41 AM  Result Value Ref Range   Color, UA yellow yellow   Clarity, UA clear clear   Glucose, UA negative negative   Bilirubin, UA negative negative   Ketones, POC UA negative negative   Spec Grav, UA 1.015    Blood, UA negative negative   pH, UA 6.0    Protein Ur, POC negative negative   Urobilinogen, UA 0.2    Nitrite, UA Negative Negative   Leukocytes, UA Trace (A) Negative  POCT Microscopic Urinalysis ([facility])     Status: Abnormal   Collection Time: 06/17/16 10:47 AM  Result Value Ref Range   WBC,UR,HPF,POC Few (A) None WBC/hpf   RBC,UR,HPF,POC None None RBC/hpf   Bacteria Few (A) None, Too numerous to count   Mucus Present (A) Absent   Epithelial Cells, UR Per Microscopy Moderate (A) None, Too numerous to count cells/hpf    Assessment and Plan :     Jaynee Eagles, PA-C [facility] [phone] 06/17/2016 11:23 AM

## 2016-06-18 LAB — URINE CULTURE: Colony Count: >=100000

## 2016-06-20 LAB — PAP IG, CT-NG NAA, HPV HIGH-RISK
CHLAMYDIA PROBE AMP: NOT DETECTED
GC PROBE AMP: NOT DETECTED
HPV DNA High Risk: DETECTED — AB

## 2016-06-20 MED ORDER — AMOXICILLIN 500 MG PO CAPS: 500.0000 mg | ORAL_CAPSULE | Freq: Three times a day (TID) | ORAL | 0 refills | Status: DC

## 2016-06-20 NOTE — Progress Notes (Signed)
Subjective:    Patient ID: Isabel Deleon, female    DOB: [birth year], 50 y.o.   MRN: [id number] By signing my name below, I, Judithe Modest, attest that this documentation has been prepared under the direction and in the presence of Delman Cheadle, MD. Electronically Signed: Judithe Modest, ER Scribe. 06/17/2016. 11:34 AM.  Chief Complaint  Patient presents with  . Abdominal Pain    x 2 weeks  . Back Pain  . Urinary Frequency  . Flu Vaccine    Abdominal Pain  Associated symptoms include dysuria and frequency. Pertinent negatives include no diarrhea, fever, headaches, hematuria, nausea or vomiting.  Back Pain  Associated symptoms include abdominal pain, dysuria and pelvic pain. Pertinent negatives include no fever, headaches or weakness.  Urinary Frequency   Associated symptoms include flank pain, frequency and urgency. Pertinent negatives include no chills, hematuria, nausea or vomiting.   HPI Comments: Isabel Deleon is a 50 y.o. female who presents to [facility] complaining of dysuria, vaginal itching, nocturia, flank pain, blood in feces, constipation, urinary urgency, and frequency. She has no past hx of regular bladder infections. She had a bladder infection six weeks ago and was treated with cipro. Culture showed the pathogen was resistant to amp, augmentin, gentamicin, tobramycin, and bactrum. She has an appointment with [facility] on 09/15 with Dr. Aline August.   She states the cipro did not seem to help her sx at all. Until today, it had been over 3-5 years since her last pelvic exam.  Past Medical History:  Diagnosis Date  . Hyperlipidemia   . UTI (lower urinary tract infection)    No Known Allergies  Current Outpatient Prescriptions on File Prior to Visit  Medication Sig Dispense Refill  . Multiple Vitamin (MULTIVITAMIN) tablet Take 1 tablet by mouth daily.     No current facility-administered medications on file prior to visit.     Review of Systems    Constitutional: Negative for activity change, appetite change, chills and fever.  Gastrointestinal: Positive for abdominal pain. Negative for abdominal distention, diarrhea, nausea and vomiting.  Genitourinary: Positive for dysuria, flank pain, frequency, pelvic pain and urgency. Negative for decreased urine volume, enuresis, genital sores, hematuria, menstrual problem, vaginal bleeding and vaginal discharge.  Musculoskeletal: Positive for back pain.  Allergic/Immunologic: Negative for immunocompromised state.  Neurological: Negative for weakness and headaches.      Objective:   Physical Exam  Constitutional: She is oriented to person, place, and time. She appears well-developed and well-nourished. No distress.  HENT:  Head: Normocephalic and atraumatic.  Eyes: Pupils are equal, round, and reactive to light.  Neck: Neck supple.  Cardiovascular: Normal rate, regular rhythm, normal heart sounds and intact distal pulses.   Pulmonary/Chest: Effort normal and breath sounds normal. No respiratory distress.  Abdominal: Soft. Bowel sounds are normal. She exhibits no distension. There is no tenderness. There is no rebound and no guarding.  Genitourinary: Uterus normal. Pelvic exam was performed with patient supine. There is no rash, tenderness or lesion on the right labia. There is no rash, tenderness or lesion on the left labia. Cervix exhibits no motion tenderness and no friability. Right adnexum displays no mass, no tenderness and no fullness. Left adnexum displays no mass, no tenderness and no fullness. No erythema or tenderness in the vagina. Vaginal discharge found.  Genitourinary Comments: Cervix difficult to visualize, small amount of brown mucoid discharge. Uterus nontender, normal labia, adenexa.   Musculoskeletal: Normal range of motion.  Lymphadenopathy:  Right: No inguinal adenopathy present.       Left: No inguinal adenopathy present.  Neurological: She is alert and oriented to  person, place, and time. Coordination normal.  Skin: Skin is warm and dry. She is not diaphoretic.  Psychiatric: She has a normal mood and affect. Her behavior is normal.  Nursing note and vitals reviewed.  BP 120/70   Pulse 72   Temp 97.4 F (36.3 C) (Oral)   Resp 18   Ht 5\' 1"  (1.549 m)   Wt 207 lb 2 oz (94 kg)   SpO2 98%   BMI 39.14 kg/m      Assessment & Plan:   1. Dysuria   2. Pelvic pain in female   3. Constipation, unspecified constipation type   4. Screening for cervical cancer   5. Routine screening for STI (sexually transmitted infection)   6. Urinary frequency   7. Group B streptococcal bacteriuria   Pt has had + UClx prior even when UAs have looked benign so will proceed with UTI trx with macrobid.  Pt has appt with urology in 2 wks - she was referred prior due to h/o recurrent UTIs.  ADDENDUM: UClx showing GBS - due to quantity and pt's persistent UTI sxs will trx with amox 500 tid x 10d, ok to d/c macrobid.  Orders Placed This Encounter  Procedures  . Urine culture  . POCT urinalysis dipstick  . POCT Microscopic Urinalysis ([facility])  . POCT Wet + KOH Prep    Meds ordered this encounter  Medications  . fluconazole (DIFLUCAN) 150 MG tablet    Sig: Take 1 tablet (150 mg total) by mouth once. Now. Repeat after antibiotic course is complete.    Dispense:  2 tablet    Refill:  0  . DISCONTD: nitrofurantoin, macrocrystal-monohydrate, (MACROBID) 100 MG capsule    Sig: Take 1 capsule (100 mg total) by mouth 2 (two) times daily.    Dispense:  14 capsule    Refill:  0  . amoxicillin (AMOXIL) 500 MG capsule    Sig: Take 1 capsule (500 mg total) by mouth 3 (three) times daily.    Dispense:  30 capsule    Refill:  0    I personally performed the services described in this documentation, which was scribed in my presence. The recorded information has been reviewed and considered, and addended by me as needed.   Delman Cheadle, M.D.  [facility] [address] [place], [place] [address] [phone] phone [phone] fax  06/20/16 2:12 PM  Results for orders placed or performed in visit on 06/17/16  Urine culture  Result Value Ref Range   Colony Count >=100,000 COLONIES/ML    Organism ID, Bacteria GROUP B STREP (S.AGALACTIAE) ISOLATED   POCT urinalysis dipstick  Result Value Ref Range   Color, UA yellow yellow   Clarity, UA clear clear   Glucose, UA negative negative   Bilirubin, UA negative negative   Ketones, POC UA negative negative   Spec Grav, UA 1.015    Blood, UA negative negative   pH, UA 6.0    Protein Ur, POC negative negative   Urobilinogen, UA 0.2    Nitrite, UA Negative Negative   Leukocytes, UA Trace (A) Negative  POCT Microscopic Urinalysis ([facility])  Result Value Ref Range   WBC,UR,HPF,POC Few (A) None WBC/hpf   RBC,UR,HPF,POC None None RBC/hpf   Bacteria Few (A) None, Too numerous to count   Mucus  Present (A) Absent   Epithelial Cells, UR Per Microscopy Moderate (A) None, Too numerous to count cells/hpf  POCT Wet + KOH Prep  Result Value Ref Range   Yeast by KOH Absent Present, Absent   Yeast by wet prep Absent Present, Absent   WBC by wet prep Too numerous to count  None, Few, Too numerous to count   Clue Cells Wet Prep HPF POC None None, Too numerous to count   Trich by wet prep Absent Present, Absent   Bacteria Wet Prep HPF POC Few None, Few, Too numerous to count   Epithelial Cells By [employer] Pref ([facility]) Moderate (A) None, Few, Too numerous to count   RBC,UR,HPF,POC Moderate (A) None RBC/hpf  Pap IG, CT/NG NAA, and HPV (high risk)  Result Value Ref Range   HPV DNA High Risk     Specimen adequacy: SEE NOTE    FINAL DIAGNOSIS: SEE NOTE    COMMENTS: SEE NOTE    Cytotechnologist: SEE NOTE    Chlamydia Probe Amp     GC Probe Amp

## 2016-07-10 ENCOUNTER — Telehealth: Payer: Self-pay | Admitting: *Deleted

## 2016-07-10 NOTE — Telephone Encounter (Signed)
Patient notified of results she is still having some urinary issues.  She feels like she cannot empty her bladder and is holding her urine til it hurts.  Advised to not hold urine and go when she has to go and to call urologist or come in for re-evaluation.  Can you please comment on her PAP. 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

## 2016-07-10 NOTE — Telephone Encounter (Signed)
Sent to lab pool Pap normal. + HR HPV so repeat co-test in 1 year. If she is still having urinary symptoms, needs to come back in for repeat testing.

## 2016-07-11 NOTE — Telephone Encounter (Signed)
Spoke with pt. Urinary symptoms continue not as bad. Instructed pt to come in for a follow up if needed.

## 2016-07-28 ENCOUNTER — Ambulatory Visit: Payer: BC Managed Care – PPO

## 2016-07-29 ENCOUNTER — Ambulatory Visit (INDEPENDENT_AMBULATORY_CARE_PROVIDER_SITE_OTHER): Payer: BC Managed Care – PPO | Admitting: Family Medicine

## 2016-07-29 VITALS — BP 112/76 | HR 77 | Temp 98.0°F | Resp 18 | Ht 61.0 in | Wt 204.0 lb

## 2016-07-29 DIAGNOSIS — B977 Papillomavirus as the cause of diseases classified elsewhere: Secondary | ICD-10-CM

## 2016-07-29 DIAGNOSIS — G8929 Other chronic pain: Secondary | ICD-10-CM | POA: Diagnosis not present

## 2016-07-29 DIAGNOSIS — M545 Low back pain, unspecified: Secondary | ICD-10-CM

## 2016-07-29 DIAGNOSIS — I83029 Varicose veins of left lower extremity with ulcer of unspecified site: Secondary | ICD-10-CM

## 2016-07-29 DIAGNOSIS — L97929 Non-pressure chronic ulcer of unspecified part of left lower leg with unspecified severity: Secondary | ICD-10-CM

## 2016-07-29 MED ORDER — DICLOFENAC SODIUM 75 MG PO TBEC: 75.0000 mg | DELAYED_RELEASE_TABLET | Freq: Two times a day (BID) | ORAL | 2 refills | Status: DC | PRN

## 2016-07-29 MED ORDER — DICLOFENAC SODIUM 75 MG PO TBEC: 75.0000 mg | DELAYED_RELEASE_TABLET | Freq: Two times a day (BID) | ORAL | 0 refills | Status: DC | PRN

## 2016-07-29 MED ORDER — CYCLOBENZAPRINE HCL 10 MG PO TABS: 10.0000 mg | ORAL_TABLET | Freq: Every day | ORAL | 2 refills | Status: DC

## 2016-07-29 NOTE — Patient Instructions (Addendum)
Do not use the diclofenac with any other otc pain medication other than tylenol/acetaminophen - so no aleve, ibuprofen, motrin, advil, etc.  Wear the compression socks during the day all winter so that your varicose veins don't get worse.     IF you received an x-ray today, you will receive an invoice from [facility]. Please contact [facility] at [phone] with questions or concerns regarding your invoice.   IF you received labwork today, you will receive an invoice from [employer]. Please contact [facility] at [phone] with questions or concerns regarding your invoice.   Our billing staff will not be able to assist you with questions regarding bills from these companies.  You will be contacted with the lab results as soon as they are available. The fastest way to get your results is to activate your My Chart account. Instructions are located on the last page of this paperwork. If you have not heard from [place] regarding the results in 2 weeks, please contact this office.    We recommend that you schedule a mammogram for breast cancer screening. Typically, you do not need a referral to do this. Please contact a local imaging center to schedule your mammogram.  [facility] - [phone]  *ask for the Radiology Department [facility] - [phone] or [phone]  [facility] - [phone] [place] [phone] [facility] - [phone]  *ask for the [facility] - [phone]  *ask for the Radiology Department [facility] - [phone]  *ask for the Mammography Department [place] - [phone]    Marin con rehabilitacin (Low Back Strain With Rehab) Ardelia Mems distensin es una lesin en la que el tendn o msculo se rasga. Los msculos y tendones de la cintura son susceptibles  de sufrir distensiones. Sin embargo, estos msculos y tendones son Orlene Erm fuertes y se requiere de una gran fuerza para lesionarlos. Los esguinces se clasifican en tres categoras. En el esguince de grado 1 hay dolor, pero el tendn no est estirado. En los esguinces de grado 2 hay un ligamento alargado, debido a un estiramiento o desgarro parcial. En el esguince de grado 2 an hay funcionamiento, aunque ste puede disminuir. Una distensin en grado 3 es la ruptura completa del msculo o el tendn, y suele quedar incapacitada la funcin. SNTOMAS  Dolor en la cintura.  Dolor en la espalda, que a menudo afecta ms a un lado que al [employer] Corporation.  Dolor que empeora con los movimientos y se siente en las caderas, las nalgas o la zona posterior del muslo.  Espasmos en los msculos de la espalda.  Hinchazn a lo largo de los msculos de la espalda.  Prdida de la fuerza en estos msculos.  Ruido de "crack" (crepitacin) al tocarlos. CAUSAS  La distensin se produce cuando se coloca una fuerza en el msculo o tendn que es mayor de lo que puede soportar. Las causas ms frecuentes de la lesin son:  Rella Larve prolongado de la unidad msculo - tendn en la zona de la cintura, generalmente debido a Sport and exercise psychologist.  Una nica lesin o fuerza violenta aplicada en la espalda. LOS RIESGOS AUMENTAN CON  Cualquier deporte en el que el movimiento ocasione una fuerza de giro en la espalda o una gran inclinacin de la cintura; (ftbol americano, rugby, levantamiento de pesas, bowling, golf, tenis, patinaje, deportes con raqueta, natacin, carrera,  gimnasia, clavados).  Poca fuerza y flexibilidad.  No hacer un precalentamiento adecuado.  Historia familiar de dolor de cintura o trastornos en discos.  Cirugas previas en la espalda (especialmente fusin).  Tcnica incorrecta al levantar objetos pesados.  Permanecer largo tiempo sentado, especialmente de manera incorrecta. MEDIDAS DE  PREVENCIN  Aprenda y Advice worker correcta al sentarse o al levantarse (mantener la postura correcta al sentarse; levantar objetos [place] y las piernas y no la cintura).  Precalentamiento adecuado y elongacin antes de la [place].  Descanso y recuperacin entre actividades.  Mantener la forma fsica:  Kerry Hough, flexibilidad y resistencia muscular.  Capacidad cardiovascular. PRONSTICO Si se trata adecuadamente, generalmente es curable dentro de las 6 semanas. POSIBLES COMPLICACIONES:  La recurrencia frecuente de los sntomas puede dar como resultado un problema crnico.  La inflamacin crnica, degeneracin cicatrizal del tendn y ruptura parcial del tendn.  Demora de la curacin o de la resolucin de los sntomas.  Discapacidad prolongada. CONSIDERACIONES [employer] Foods PARA EL TRATAMIENTO El tratamiento inicial incluye el uso de medicamentos y la aplicacin de hielo para reducir Conservation officer, historic buildings y la inflamacin. Los ejercicios de elongacin y fortalecimiento pueden ayudar a reducir Conservation officer, historic buildings con la [place]. Los ejercicios pueden Press photographer o con un terapeuta. Los Apple Computer graves pueden requerir la derivacin a un fisioterapeuta para Film/video editor evaluacin y Medical laboratory scientific officer un tratamiento, como ultrasonido. El profesional podr indicarle el uso de un dispositivo ortopdico para ayudar a Dietitian y la inflamacin. A menudo, demasiado reposo en cama podr resultar en ms daos que beneficios. Podrn prescribirle inyecciones de corticoides. Sin embargo, esto deber reservarse para los casos ms graves. Es Theatre manager uso de la espalda cuando se levantan objetos. Por la noche, se aconseja que usted [place], sobre un colchn firme y coloque una almohada debajo de las rodillas. Si no se obtiene xito con Music therapist, ser necesario someterse a [place].  MEDICAMENTOS   Si es necesaria la administracin de medicamentos para Engineer, petroleum, se recomiendan los antiinflamatorios no esteroides, como aspirina e ibuprofeno y otros calmantes menores, como acetaminofeno.  No tome medicamentos para el dolor dentro de los 7 das previos a la [place].  El profesional podr prescribirle calmantes si lo considera necesario. Utilcelos como se le indique y slo cuando lo necesite.  Podr beneficiarse con Liz Cla[employer]orne.  En algunos casos se indica una inyeccin de corticosteroides. Estas inyecciones deben reservarse para los casos graves, porque slo se pueden administrar una determinada cantidad de veces. CALOR Y FRO   El fro (con hielo) debe aplicarse durante 10 a 15 minutos cada 2  3 horas para reducir la inflamacin y Conservation officer, historic buildings e inmediatamente despus de cualquier actividad que agrava los sntomas. Utilice bolsas o un masaje de hielo.  El calor puede usarse antes de Neurosurgeon y [place] fortalecimiento indicadas por el profesional, le fisioterapeuta o Industrial/product designer. Utilice una bolsa trmica o un pao hmedo. SOLICITE ATENCIN MDICA SI:   Los sntomas empeoran o no mejoran en 2 a 4 semanas, an realizando Lexicographer.  Siente adormecimiento, debilitamiento o prdida de control de la vejiga o el intestino.  Desarrolla nuevos e inexplicables sntomas. (Los medicamentos indicados en el tratamiento le ocasionan efectos secundarios). [place] personas con dolor de espalda baja encuentran que sus sntomas empeoran al doblarse hacia adelante (flexin) o al arquear  la regin inferior de la espalda (extensin). Los ejercicios que le ayudarn a Investment banker, operational sus sntomas se Furniture conservator/restorer.  El mdico, fisioterapeuta o Radiation protection practitioner ayudarn a Teacher, adult education qu ejercicios sern de ayuda para resolver su dolor de espalda. No realice ningn ejercicio sin consultarlo antes con el profesional. Discontinue los  ejercicios que empeoran sus sntomas, hasta que hable con el mdico. Si siente dolor, entumecimiento u hormigueo que [employer]oles[employer] glteos, piernas o pies, el objetivo de esta terapia es que estos sntomas se acerquen a la espalda y Occupational hygienist. A veces, estos sntomas en las piernas mejoran, pero el dolor de espalda empeora. Este suele ser un indicio de progreso en su rehabilitacin. Asegrese de que estar atento a cualquier cambio en sus sntomas y las actividades que ha General Electric 24 horas antes del cambio. Compartir esta informacin con su mdico le permitir mayor eficiencia para tratar su enfermedad.  Estos ejercicios le ayudarn en la recuperacin de la lesin. Los sntomas podrn aliviarse con o sin asistencia adicional de su mdico, fisioterapeuta o Administrator, sports. Al completar estos ejercicios, recuerde:  Restaurar la flexibilidad del tejido ayuda a que las articulaciones recuperen el movimiento normal. Esto permite que el movimiento y la actividad sea ms saludables y menos dolorosos.  Para que sea efectiva, cada elongacin debe realizarse durante al menos 30 segundos.  La elongacin nunca debe ser dolorosa. Deber sentir slo un alargamiento o distensin suave del tejido que estira. EJERCICIOS DE AMPLITUD DE MOVIMIENTOS Y ELONGACIN: ELONGACION Flexin - una rodilla al pecho  Recustese en una cama dura o sobre el piso, con ambas piernas extendidas al frente.  Manteniendo una pierna en contacto con el piso, lleve la rodilla opuesta al pecho. Mantenga la pierna en esa posicin, sostenindola por la zona posterior del muslo o por la rodilla.  Presione hasta sentir un suave estiramiento en la cintura. Mantenga esta posicin durante __________ segundos.  Libere la pierna lentamente y repita el ejercicio con el lado opuesto. Reptalo __________ veces. Realice este ejercicio __________ veces por da.  ELONGACIN Flexin, dos rodillas al pecho   Recustese en una cama  dura o sobre el piso, con ambas piernas extendidas al frente.  Manteniendo una pierna en contacto con el piso, lleve la rodilla opuesta al pecho.  Tense los msculos del estmago para apoyar la espalda y levante la otra rodilla [place]. Mantenga las piernas en su lugar y tmese por detrs [place].  Con ambas rodillas en el pecho, tire hasta que sienta un estiramiento en la parte trasera de la espalda. Mantenga esta posicin durante __________ segundos.  Tense los msculos del estmago y baje las piernas de a una por vez. Reptalo __________ veces. Realice este ejercicio __________ veces por da.  ELONGACIN Rotacin de la zona baja del tronco  Recustese sobre una cama firme o sobre el suelo. [place], doble las rodillas de modo que ambas apunten hacia el techo y los pies queden bien apoyados en el piso.  Extienda los brazos a Teaching laboratory technician. Esto estabilizar la zona superior del cuerpo, manteniendo los hombros en contacto con el piso.  Suave y lentamente deje caer ambas rodillas juntas hacia un lado, hasta sentir un ligero estiramiento en la cintura. Mantenga esta posicin durante __________ segundos.  Tensione los Apple Computer del estmago para Nature conservation officer la cintura mientras lleva las rodillas nuevamente a la posicin [place]. Reptalo __________ veces.  Realice este ejercicio __________ veces por da. EJERCICIOS DE AMPLITUD DE MOVIMIENTOS Y FLEXIBILIDAD: ELONGACIN Extensin posicin prona sobre los codos  Acustese sobre el estmago sobre el piso, una cama ser muy blanda. Coloque las palmas a una distancia igual al ancho de los hombres y a la altura de la cabeza.  Coloque los codos bajo los hombros. Si siente dolor, colquese almohadas debajo del pecho.  Deje que su cuerpo se relaje, de modo que las caderas queden ms abajo y tengan ms contacto con el piso.  Mantenga esta posicin durante __________  segundos.  Vuelva lentamente a la posicin plana sobre el piso. Reptalo __________ veces. Realice este ejercicio __________ veces por da.  [place] de brazos en posicin prona  Acustese sobre el [employer] piso, una cama ser [place]. Coloque las palmas a una distancia igual al ancho de los hombres y a la altura de la cabeza.  Mantenga la espalda tan relajada como pueda, enderece lentamente los codos mientras mantiene las caderas contra el suelo. Puede modificar la posicin de las manos para estar ms cmodo. A medida que gana movimiento, sus manos quedarn ms por debajo de los hombros.  Mantenga cada posicin durante __________segundos.  Vuelva lentamente a la posicin plana sobre el piso. Reptalo __________ veces. Realice este ejercicio __________ veces por da.  AMPLITUD DE MOVIMIENTOS Cuadrpedo [place] vertebral neutral  [place] y las rodillas en una superficie firme. Las manos deben quedar a la altura de los hombros y las rodillas [place]. Puede colocar algo debajo las rodillas para estar ms cmodo.  Haga caer la cabeza y apunte el cccix hacia el suelo debajo de usted. De este modo se redondear la cintura, en [place] similar a un gato enojado. Mantenga esta posicin durante __________ segundos.  Lentamente levante la cabeza y afloje el cccix para que se hunda el cuerpo en un gran arco, como un caballo.  Mantenga esta posicin durante __________ segundos.  Reptalo hasta sentir calor en la cintura.  Ahora encuentre su "punto ideal". Ser la posicin ms cmoda [employer]idental Petroleum. En esta posicin es cuando su columna est neutral. Una vez que encuentre la posicin, tensione los msculos del estmago para sostener la zona inferior de la espalda.  Mantenga esta posicin durante __________ segundos. Reptalo __________ veces. Realice este ejercicio __________ veces por da.  EJERCICIOS  DE FORTALECIMIENTO - Distensin de la cintura Estos ejercicios le ayudarn en la recuperacin de la lesin. Estos ejercicios deben hacerse cerca de su "punto dulce". Este es el arco neutro, de la parte baja de la espalda, en algn lugar entre la posicin completamente redondeada y arqueada plenamente, que es la posicin menos dolorosa. Cuando se realiza en [employer] de seguridad del movimiento, estos ejercicios se pueden Risk manager para las personas que tienen una lesin basada en flexin o extensin. Con estos ejercicios, los sntomas podrn desaparecer con o sin mayor intervencin del profesional, el fisioterapeuta o Industrial/product designer. Al completar estos ejercicios, recuerde:   Los msculos pueden ganar la resistencia y la fuerza necesarias para las actividades diarias a travs de ejercicios controlados.  Realice los ejercicios como se lo indic el mdico, el fisioterapeuta o Industrial/product designer. Aumente la resistencia y las repeticiones segn se le haya indicado.  Podr experimentar dolor o cansancio muscular, pero el dolor o molestia que trata de eliminar a travs de los ejercicios nunca debe empeorar. Si el dolor [place], detngase y asegrese  de que est siguiendo las directivas correctamente. Si an siente dolor luego de Optometrist lo ajustes necesarios, deber discontinuar el ejercicio hasta que pueda conversar con el profesional sobre el problema. FORTALECIMIENTO - Abdominales profundos - Inclinacin plvica  Recustese sobre una cama firme o sobre el suelo. [place], doble las rodillas de modo que ambas apunten hacia el techo y los pies queden bien apoyados en el piso.  Tensione la zona baja de los msculos abdominales para presionar la Materials engineer. Este movimiento har rotar su pelvis de modo que el cccix quede hacia arriba y no apuntando a los pies o hacia el piso.  Con una tensin suave y respiracin pareja, mantenga esta posicin durante __________ segundos. Reptalo  __________ veces. Realice este ejercicio __________ veces por da.  FORTALECIMIENTO Abdominales encogimiento abdominal  Recustese sobre una cama firme o sobre el suelo. [place], doble las rodillas de modo que ambas apunten hacia el techo y los pies queden bien apoyados en el piso. [place].  Apunte suavemente con la barbilla hacia abajo, sin doblar el cuello.  Tensione los abdominales y eleve lentamente el tronco la altura suficiente para despegar los omplatos. Si se eleva ms, pondr tensin excesiva en la cintura y esto no fortalecer ms los abdominales.  Controle la vuelta a la posicin inicial. Reptalo __________ veces. Realice este ejercicio __________ veces por da.  EN CUATRO MIEMBROS Cuadrpedo, elevacin de miembro superior e inferior opuestos   [employer] y las rodillas en una superficie firme. Las manos deben quedar a la altura de los hombros y las rodillas [place]. Puede colocar algo debajo las rodillas para estar ms cmodo.  Encuentre la posicin neutral de la columna vertebral y Heritage manager los msculos abdominales de modo que pueda mantener esta posicin. Los hombros y las caderas deben formar un rectngulo paralelo con el suelo y recto.  Manteniendo el tronco firme, eleve la mano derecha a la altura del hombro y luego eleve la pierna izquierda a la altura de la cadera. Asegrese de no contener la respiracin. Mantenga cada posicin durante __________ segundos.  Con los msculos abdominales en tensin y la espalda firme, vuelva lentamente a la posicin inicial. Repita con el otro brazo y la otra pierna. Reptalo __________ veces. Realice este ejercicio __________ veces por da.  FORTALECIMIENTO Abdominales bajos Elevacin de ambas rodillas  Recustese sobre una cama firme o sobre el suelo. [employer], doble las rodillas de modo que ambas apunten hacia el techo y los pies queden  bien apoyados en el piso.  Tensione los msculos abdominales que se encuentran en la cintura y eleve lentamente ambas rodillas hasta que queden sobre las caderas. Asegrese de no contener la respiracin.  Mantenga esta posicin durante __________ segundos. Usando los abdominales, regrese a la posicin inicial en un modo lento y controlado. Reptalo __________ veces. Realice este ejercicio __________ veces por da.  CONSIDERACIONES ACERCA DE LA POSTURA Y LA MECNICA DEL CUERPO Ditensin de la cintura Si mantiene una postura correcta cuando se encuentre de pie, sentado o realizando sus actividades, reducir el estrs [employer] diferentes tejidos del cuerpo, y Advertising account executive a los tejidos lesionados la posibilidad de curarse y Engineering geologist las experiencias dolorosas. A continuacin se indican pautas generales para mejorar la postura- Su mdico o fisioterapeuta le dar instrucciones especficas segn sus necesidades. Al leer estas pautas recuerde:  Los ejercicios indicados por su mdico lo  ayudarn a Scientist, product/process development flexibilidad y la fuerza para Theatre manager las posturas correctas.  La postura correcta proporciona el mejor entorno de trabajo para las articulaciones. Las articulaciones se desgastan menos cuando estn sostenidas adecuadamente por una columna vertebral en buena postura. Esto significa que su cuerpo estar ms sano y Network engineer.  La correcta postura debe practicarse en todas las actividades, especialmente al estar sentado o de pie durante [place]. Tambin es importante al realizar actividades repetitivas de bajo estrs (tipeo) o una actividad nica y pesada. POSICIONES DE Cathe Mons Tenga en cuenta cules son las posturas que ms dolor le causan al elegir una posicin de descanso. Si siente dolor con las actividades en que deba realizar una flexin (sentarse, inclinarse, detenerse, ponerse en cuclillas), elija una posicin que le permita descansar en una postura menos flexionada. Evite curvarse en posicin  fetal cuando se encuentre de lado. Si el dolor empeora con las actividades basadas en la extensin (estar de pie durante un tiempo prolongado, trabajar con las manos por arriba de la cabeza) evite descansar en Ardelia Mems posicin extendida durante mucho tiempo, como dormir sobre el [place]. La [employer] de las Artist cmodo el descanso sobre la columna vertebral en una posicin neutral, ni muy redondeada ni [place]. Recustese sobre su lado en una cama que no est hundida con una almohada entre las rodillas o sobre la espalda con una almohada bajo las rodillas, y sentir [place]. Tenga en cuenta que cualquier posicin en General Electric, no importa si es una postura [place], puede provocarle rigidez. POSTURAS CORRECTAS PARA SENTARSE Con el fin de minimizar el estrs y Health and safety inspector en su columna, deber sentarse con la postura correcta. Sentarse con una buena postura debe ser algo sin esfuerzo para un cuerpo sano. Recuperar una buena postura es un proceso gradual. Muchas personas pueden trabajar ms cmodas mediante el uso de diferentes soportes hasta que tengan la flexibilidad y la fuerza para mantener esta postura por su cuenta. Al sentarse con la [employer], los odos deben estar sobre los hombros y los hombros [place]. Debe utilizar el respaldo de la silla para apoyar la espalda. La espalda estar en una posicin neutral, ligeramente arqueada. Puede colocar una pequea almohada o toalla doblada en la base de la espalda baja para apoyo.  Si trabaja en un escritorio, cree un ambiente que le proporciones un buen soporte y [place]. Sin apoyo adicional, msculos se cansan, lo que lleva a una tensin excesiva en las articulaciones y otros tejidos. Tenga en cuenta estas recomendaciones: SILLA:   La silla debe poder deslizarse por debajo del escritorio cuando su espalda tome contacto con el respaldo. Esto le permitir trabajar ms cerca.  La altura de la silla debe  permitirle que los ojos tengan el nivel de la parte superior del monitor y las manos estn ms abajo que los codos. POSICIN DEL CUERPO  Los pies deben tener contacto con el piso. Si no es posible, use un posapies.  Mantenga las [employer] hombros. Esto reducir el estrs en el cuello y en la cintura. POSTURAS INCORRECTAS PARA SENTARSE  Si se siente cansado e incapaz de asumir una postura sentada sana, no se eche hacia atrs. Esto pone una tensin excesiva en los tejidos de su espalda, y causa ms dao y Social research officer, government. [place] opciones ms saludables se incluyen:  El uso de ms apoyo, como una almohada lumbar.  Cambio de tareas, a algo que demande una posicin vertical o caminar.  Tomar  una breve caminata.  Recostarse y Physicist, medical posicin neutral. DE PIE DURANTE UN TIEMPO PROLONGADO E INCLINADO LIGERAMENTE HACIA ADELANTE Cuando deba realizar una tarea que requiera inclinacin hacia adelante estando de pie en el mismo sitio durante mucho tiempo, coloque un pie en un objeto de 2 a 4 pulgadas de alto, para [employer] Insurance. Cuando ambos pies estn en el piso, la zona inferior de la espalda tiene a perder su ligera curvatura hacia adentro. Si esta curva se aplana (o se pronuncia demasiado) la espalda y las articulaciones experimentarn demasiado estrs, se fatigarn ms rpidamente y Therapist, sports. POSTURAS CORRECTAS PARA ESTAR DE PIE Una postura adecuada de pie realizarse en todas las actividades diarias, incluso si slo toman un momento, como al Mellon Financial. Como en la postura de sentado, los odos deben estar sobre los hombros y los hombros [place]. Deber mantener una ligera tensin en sus msculos abdominales para asegurar la columna vertebral. El cccix debe apuntar hacia el suelo, no detrs de su cuerpo, ya que resultara en una curvatura de la espalda sobre-extendida.  [place] posturas incorrectas para estar de pie  incluyen tener la cabeza hacia delante, las rodillas bloqueadas o una excesiva curvatura de la espalda. CAMINAR [place] en [place] erguida. Las [place], hombros y caderas deben estar alineados. ACTIVIDAD PROLONGADA EN UNA POSICIN FLEXIONADA Al completar una tarea que requiere que se doble la cintura hacia adelante o inclinarse sobre una superficie baja, trate de encontrar una manera de estabilizar 3 de cada 4 de sus miembros. Puede colocar una mano o el codo en el [place], o descansar una rodilla en la superficie en la que est apoyado. Esto le proporcionar ms estabilidad para que sus msculos no se cansen tan rpidamente. El [employer] rodillas [place], o ligeramente dobladas, tambin reducir el estrs en la espalda baja. TCNICAS CORRECTAS PARA LEVANTAR OBJETOS SI:   Asumir una postura amplia. Esto le proporcionar ms estabilidad y la oportunidad de acercarse lo ms posible al objeto que se est levantando.  Tense los abdominales para asegurar la columna vertebral. Doble las rodillas y las caderas. Manteniendo la espalda en una posicin neutral, haga el esfuerzo con los msculos de la pierna. Levntese con las piernas, manteniendo la espalda derecha.  Pruebe el peso de los objetos desconocidos antes de tratar de Special educational needs teacher.  Trate de [employer] codos hacia abajo y a los lados, con el fin de obtener la fuerza de los hombros al llevar un objeto.  Siempre pida ayuda a otra persona cuando deba levantar objetos pesados o incmodos. TCNICAS INCORRECTAS PARA LEVANTAR OBJETOS NO:   Bloquee rodillas al levantar, aunque sea un objeto pequeo.  Se doble ni gire. Gire sobre los pies ni los mueva cuando necesite cambiar de direccin.  Considere que no puede levantar incluso un clip de papel con seguridad, sin Chiropodist.   Esta informacin no tiene Marine scientist el consejo del mdico. Asegrese de hacerle al mdico cualquier pregunta que tenga.   Document Released:  07/19/2006 Document Revised: 02/16/2015 Elsevier Interactive Patient Education 2016 [place] (Human Papillomavirus) El virus del papiloma humano (VPH) es la enfermedad de transmisin sexual (ETS) ms frecuente y es altamente contagiosa. Las infecciones por el virus del VPH causan verrugas y cncer en la parte externa del tero (cuello uterino), el canal del parto(vagina), la abertura del canal de parto (vulva), y el ano. Hay ms de 100 tipos de  VPH. Por lo general, el VPH no causa sntomas, salvo por las lesiones similares a las verrugas que aparecen en la garganta o las verrugas genitales que puede ver o Quarry manager. Las personas pueden estar infectadas por largos perodos y transmitirlo a otras sin saberlo. [place] se propaga de Ardelia Mems persona a otra a travs del contacto sexual. Angelena Sole abarca la actividad sexual vaginal, oral y anal. FACTORES DE RIESGO  Mantener relaciones sexuales sin proteccin. Puede transmitirse practicando sexo vaginal, anal u oral.  Tener varios compaeros sexuales.  Tener un compaero sexual que tiene otros Editor, commissioning.  Tener o haber tenido otras enfermedades de transmisin sexual. SIGNOS Y SNTOMAS  La mayora de las personas que tienen VPH no presentan ningn sntoma. Si se presentan sntomas, estos pueden incluir los siguientes:  Lesiones similares a Wellsite geologist garganta (por practicar sexo oral).  Verrugas en la piel infectada o en las membranas mucosas.  Verrugas genitales que [employer], arder o Therapist, art.  Verrugas genitales que pueden ser dolorosas o [place]. DIAGNSTICO  Por lo general, ante la presencia de lesiones similares a las verrugas en la garganta o de verrugas genitales, el mdico puede diagnosticar el VPH mediante un examen fsico.   Las verrugas genitales se observan fcilmente a simple vista.  Actualmente, no existe un anlisis aprobado por la Administracin de  [place] y [employer]strom de los [place] (Transport planner, FDA) para Medical sales representative [employer].  En las mujeres, la prueba de Papanicolaou puede detectar clulas infectadas con el VPH.  Se utiliza un dispositivo para ver el cuello del tero (colposcopa). La colposcopa se indica si el examen plvico o la prueba de Papanicolaou no son normales. Durante la colposcopa se extrae Truddie Coco de tejido (biopsia). TRATAMIENTO  No existe un tratamiento para el propio virus. Sin embargo, hay tratamientos para los problemas de salud y los sntomas que puede Ecologist. Su mdico lo controlar rigurosamente una vez comenzado Dispensing optician. Esto se debe a que Public affairs consultant y podra requerir tratamiento nuevamente. El tratamiento del VPH puede incluir lo siguiente:   Medicamentos, que pueden inyectarse o aplicarse en forma de crema, locin o gel.  Uso de una sonda para aplicar fro extremo (crioterapia).  Aplicacin de un haz de luz intenso (tratamiento con lser).  Uso de una sonda para aplicar calor extremo (electrocauterizacin).  [place]. [place] los medicamentos solamente como se lo haya indicado el mdico.  Use cremas de venta libre para la picazn o la irritacin tal como se lo haya indicado el mdico.  Concurra a todas las visitas de control como se lo haya indicado el mdico. Esto es importante.  No toque ni rasque las verrugas.  No trate las verrugas genitales con los medicamentos utilizados para el tratamiento de las verrugas de las manos.  No puede Hydrographic surveyor.  No utilice tampones ni duchas vaginales durante el tratamiento del VPH.  Informe a su compaero sexual acerca de su infeccin porque tambin podra Warden/ranger.  Si queda embarazada, informe a su mdico que ha tenido VPH. El mdico la controlar rigurosamente durante el embarazo para asegurarse  de que el beb est sano.  Despus del tratamiento, use preservativos durante las relaciones sexuales para prevenir futuras infecciones.  Tenga solo un compaero sexual.  No tenga un compaero sexual que tenga otros Editor, commissioning. PREVENCIN   [place] con  su mdico acerca de cmo recibir las TEFL teacher. Estas vacunas previenen algunas infecciones por el VPH y cnceres. Se recomienda administrar la vacuna a hombres y [employer] 9 y 85 aos. Esta no ser efectiva si usted ya tiene el VPH y no se recomienda a las Games developer.  Se realiza una prueba de Papanicolaou para detectar el cncer de cuello del tero en las mujeres.  La primera prueba de Papanicolaou debe realizarse a los 21 aos.  La prueba se repite cada 2aos entre los 21 y los 29aos.  Despus de los 30 aos, debe realizarse una prueba de Papanicolaou cada tres aos siempre que los tres estudios anteriores sean normales.  Algunas mujeres sufren problemas mdicos que aumentan la probabilidad de Museum/gallery curator cncer de cuello del tero. Hable con su mdico sobre [employer]. Es muy importante que le informe a su mdico si aparecen nuevos problemas poco despus de su ltima prueba de Papanicolaou. En estos casos, el mdico podr [employer]ALCOMM se realicen controles y pruebas de Papanicolaou con ms frecuencia.  Las Southern Company son para todas las mujeres independientemente de que hayan recibido o no la vacuna para Risk manager.  Si le han realizado una histerectoma por un problema que no era cncer u otra enfermedad que podra causar cncer, ya no necesitar un Papanicolaou. Sin embargo, aunque ya no necesite hacerse un Papanicolaou, es una buena idea hacerse un examen regularmente para asegurarse de que no haya otros problemas.  Si tiene entre 71 y 75 aos y ha tenido un resultado normal en la prueba de Papanicolaou en los ltimos 10 aos, ya no ser Building services engineer. Sin embargo, aunque ya no  necesite hacerse un Papanicolaou, es una buena idea hacerse un examen regularmente para asegurarse de que no haya otros problemas.  Si ha recibido un tratamiento para Science writer cervical o una enfermedad que podra causar cncer, necesitar realizarse una prueba de Papanicolaou y controles durante al menos 57 aos de concluido el [place].  Si no se ha Futures trader con regularidad, Chief Executive Officer a evaluarse los factores de riesgo (como el tener un nuevo compaero sexual) para Programmer, applications a Actuary.  Es posible que algunas mujeres deban realizarse exmenes de deteccin con mayor frecuencia si presentan un alto riesgo de padecer cncer de cuello del tero. SOLICITE ATENCIN MDICA SI:   La piel tratada se enrojece, se hincha o duele.  Tiene fiebre.  Siente un Pharmacist, hospital.  Siente bultos o protuberancias tipo granos en la zona genital o alrededor de esta.  Presenta sangrado vaginal o en la zona de tratamiento.  Tiene dolor durante las [employer]. ASEGRESE DE QUE:   Comprende estas instrucciones.  Controlar su afeccin.  Solicitar ayuda de inmediato si no mejora o si empeora.   Esta informacin no tiene Marine scientist el consejo del mdico. Asegrese de hacerle al mdico cualquier pregunta que tenga.   Document Released: 01/18/2009 Document Revised: 10/23/2014 Elsevier Interactive Patient Education [employer].

## 2016-07-29 NOTE — Progress Notes (Addendum)
Subjective:  By signing my name below, I, [name], attest that this documentation has been prepared under the direction and in the presence of Delman Cheadle, MD. Electronically Signed: [name], [place]. 07/29/2016 , 9:09 AM .  Patient was seen in Room 1 .   Patient ID: Isabel Deleon, female    DOB: 1966/09/28, 50 y.o.   MRN: [id number] Chief Complaint  Patient presents with  . Leg Pain  . Hip Pain  . Flu Vaccine   HPI Isabel Deleon is a 50 y.o. female who presents to [facility] complaining of left leg back and back pain. She had been reporting recurrent UTI's so she had been referred to a urologist. She saw Dr. Alyson Ingles 1 month prior. 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 Her pap smear done at that visit showed positive HPV, but negative cytology, so patient was recommended to have a repeat pap and HPV testing, as well as type 16 and 18 in 1 year.   Patient reports not having relief with the medication. She informed her mother who's in [place] and was sent medication for some relief. She is unsure what the medication is.   She also notes having venous surgery over her left leg recently. She receives injections for this issue as well. Her parking space is far at work and requires her to walk a longer distance, causing her leg pain to worsen. She requests to have handicap stick to park closer to reduce to the pain. She denies swelling in her legs at the end of the day.   She also complains of new back pain and often has to sit on hard floor. When she gets up, she feels the back pain aggravate. She describes the pain located sometimes in the middle and sometimes over to 1 side. She hasn't taken any OTC medication.   She also requested flu vaccine today.  Patient's primary language is [place]. A stratus translator was called: Rica Mote [id number]  Past Medical History:    Diagnosis Date  . Hyperlipidemia   . UTI (lower urinary tract infection)    Prior to Admission medications   Medication Sig Start Date End Date Taking? Authorizing Provider  amoxicillin (AMOXIL) 500 MG capsule Take 1 capsule (500 mg total) by mouth 3 (three) times daily. 06/20/16   Shawnee Knapp, MD  Multiple Vitamin (MULTIVITAMIN) tablet Take 1 tablet by mouth daily.    Historical Provider, MD   No Known Allergies  Past Surgical History:  Procedure Laterality Date  . TUBAL LIGATION      Review of Systems  Constitutional: Positive for activity change. Negative for appetite change, chills and fatigue.  Cardiovascular: Negative for leg swelling.  Gastrointestinal: Negative for abdominal pain.  Musculoskeletal: Positive for arthralgias, back pain and myalgias. Negative for gait problem and joint swelling.  Neurological: Positive for seizures. Negative for dizziness, weakness, numbness and headaches.  Psychiatric/Behavioral: Positive for self-injury.       Objective:   Physical Exam  Constitutional: She is oriented to person, place, and time. She appears well-developed and well-nourished. No distress.  HENT:  Head: Normocephalic and atraumatic.  Eyes: EOM are normal. Pupils are equal, round, and reactive to light.  Neck: Neck supple.  Cardiovascular: Normal rate.   Pulmonary/Chest: Effort normal. No respiratory distress.  Musculoskeletal: Normal range of motion.  Non tender over lumbar spinous process, bilateral muscle spasms at the  base of lumbar, 5/5 strength, negative seated straight leg raise bilaterally  Neurological: She is alert and oriented to person, place, and time.  Reflex Scores:      Patellar reflexes are 2+ on the right side and 2+ on the left side.      Achilles reflexes are 2+ on the right side and 2+ on the left side. Skin: Skin is warm and dry.  Psychiatric: She has a normal mood and affect. Her behavior is normal.  Nursing note and vitals reviewed.    BP  112/76 (BP Location: Right Arm, Patient Position: Sitting, Cuff Size: Large)   Pulse 77   Temp 98 F (36.7 C) (Oral)   Resp 18   Ht 5\' 1"  (1.549 m)   Wt 204 lb (92.5 kg)   LMP 07/13/2016   SpO2 100%   BMI 38.55 kg/m     Assessment & Plan:   1. Chronic bilateral low back pain without sciatica   2. 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 High risk HPV infection  - repeat pap and HPV in Sept 21018 - pt requests to be reminded of this next yr when she is due.      Meds ordered this encounter  Medications  . DISCONTD: diclofenac (VOLTAREN) 75 MG EC tablet    Sig: Take 1 tablet (75 mg total) by mouth 2 (two) times daily as needed for moderate pain. For pain    Dispense:  30 tablet    Refill:  0  . cyclobenzaprine (FLEXERIL) 10 MG tablet    Sig: Take 1 tablet (10 mg total) by mouth at bedtime. For muscle spasms    Dispense:  30 tablet    Refill:  2  . diclofenac (VOLTAREN) 75 MG EC tablet    Sig: Take 1 tablet (75 mg total) by mouth 2 (two) times daily as needed for moderate pain. For pain    Dispense:  60 tablet    Refill:  2    Please put instructions in Spanish on all meds if able.    I personally performed the services described in this documentation, which was scribed in my presence. The recorded information has been reviewed and considered, and addended by me as needed.   Delman Cheadle, M.D.  [facility] [address] [place], [place] [address] [phone] phone [phone] fax  07/29/16 11:15 AM

## 2016-11-15 ENCOUNTER — Other Ambulatory Visit: Payer: Self-pay | Admitting: Infectious Disease

## 2016-11-15 ENCOUNTER — Ambulatory Visit
Admission: RE | Admit: 2016-11-15 | Discharge: 2016-11-15 | Disposition: A | Discharge status: Home / Self Care | Payer: No Typology Code available for payment source | Source: Ambulatory Visit | Attending: Infectious Disease | Admitting: Infectious Disease

## 2016-11-15 DIAGNOSIS — Z111 Encounter for screening for respiratory tuberculosis: Secondary | ICD-10-CM

## 2016-12-16 ENCOUNTER — Ambulatory Visit (INDEPENDENT_AMBULATORY_CARE_PROVIDER_SITE_OTHER): Payer: BC Managed Care – PPO | Admitting: Family Medicine

## 2016-12-16 VITALS — BP 104/66 | HR 69 | Temp 99.1°F | Resp 16 | Ht 61.0 in | Wt 210.0 lb

## 2016-12-16 DIAGNOSIS — L290 Pruritus ani: Secondary | ICD-10-CM

## 2016-12-16 DIAGNOSIS — Z789 Other specified health status: Secondary | ICD-10-CM

## 2016-12-16 DIAGNOSIS — L739 Follicular disorder, unspecified: Secondary | ICD-10-CM

## 2016-12-16 MED ORDER — DOXYCYCLINE HYCLATE 100 MG PO TABS: 100.0000 mg | ORAL_TABLET | Freq: Two times a day (BID) | ORAL | 0 refills | Status: DC

## 2016-12-16 NOTE — Patient Instructions (Addendum)
Tome doxycycline [employer] cada dia, y banos tipios.  si area aparece mas enchado/mas grande o empeorse - regrese a Air traffic controller.   si tiene frequencia de [place] , regrese.    Foliculitis (Folliculitis) La foliculitis es una inflamacin de los folculos capilares. La foliculitis ocurre con mayor frecuencia en el cuero cabelludo, los muslos, las piernas, la espalda y las nalgas. Sin embargo, Financial controller parte del cuerpo. CAUSAS Esta afeccin puede ser causada por lo siguiente:  Una infeccin bacteriana (frecuente).  Infecciones por hongos.  Infecciones virales.  Contacto con ciertas sustancias qumicas, especialmente aceites y alquitrn.  Rasurarse o depilarse.  Aplicacin frecuente de cremas o ungentos grasosos en la piel. La foliculitis de duracin prolongada y la foliculitis recurrente pueden ser causadas por bacterias que viven en las narinas. FACTORES DE RIESGO Es ms probable que esta afeccin se desarrolle en las personas que tengan lo siguiente:  Un sistema inmunitario debilitado.  Diabetes.  Obesidad. SNTOMAS Los sntomas de esta afeccin incluyen lo siguiente:  Enrojecimiento.  Inflamacin.  Hinchazn.  Picazn.  Pequeos puntos blancos o amarillos llenos de pus (pstulas) que pican y aparecen sobre una zona enrojecida. Si hay una infeccin que se adentra en el folculo, puede formarse un fornculo.  Un conjunto de fornculos agrupados estrechamente (carbunclo). Estos fornculos tienden a formarse en zonas del cuerpo con mucho pelo y sudorosas. DIAGNSTICO Esta afeccin se diagnostica con un examen de la piel. Para hallar la causa de la afeccin, el mdico puede tomar una Spearman de uno de los fornculos o pstulas y Personnel officer. TRATAMIENTO El tratamiento de este trastorno puede incluir lo siguiente:  La aplicacin de compresas calientes en la zona afectada.  La administracin de antibiticos o la aplicacin de un antibitico a  la piel.  La aplicacin de una solucin antisptica o darse un bao con esta solucin.  La administracin de un medicamento de venta libre para Astronomer.  La realizacin de un procedimiento para drenar las pstulas o los fornculos. Se puede realizar si una pstula o un fornculo contienen mucho pus o lquido.  La extraccin del pelo con lser. Se puede realizar para tratar una foliculitis de duracin prolongada. INSTRUCCIONES PARA EL CUIDADO EN EL HOGAR  Si se lo indican, aplique calor en la zona afectada tan frecuentemente como se lo haya indicado el mdico. Use la fuente de calor que el mdico le recomiende, como una compresa de calor hmedo o una almohadilla trmica.  Coloque una toalla entre la piel y la fuente de Freight forwarder.  Aplique el calor durante 20 a 67minutos.  Retire la fuente de calor si la piel se le pone de color rojo brillante. Esto es muy importante si no puede sentir el dolor, el calor o el fro. Puede correr un riesgo mayor de sufrir quemaduras.  Si le recetaron un antibitico, tmelo como se lo haya indicado el mdico. No deje de usar el antibitico aunque comience a Sports administrator.  Tome los medicamentos de venta libre y los recetados solamente como se lo haya indicado el mdico.  No rasure la piel irritada.  Concurra a todas las visitas de control como se lo haya indicado el mdico. Esto es importante. SOLICITE ATENCIN MDICA DE INMEDIATO SI:  Tiene ms enrojecimiento, hinchazn o dolor en la zona afectada.  Hay lneas rojas que se extienden desde la zona afectada.  Tiene fiebre. Esta informacin no tiene Marine scientist el consejo del mdico. Asegrese de hacerle al mdico cualquier pregunta  que tenga. Document Released: 10/02/2005 Document Revised: 04/02/2012 Document Reviewed: 07/23/2015 Elsevier Interactive Patient Education  2017 [place] anal (Anal Pruritus) El prurito anal es una sensacin de picazn en el ano y la piel  que rodea la zona anal. Es una afeccin frecuente y puede deberse a muchos factores. Suele ocurrir cuando la zona se humedece. El origen de la humedad puede ser el sudor o una pequea cantidad de materia fecal (heces) que queda en la zona debido a una higiene personal deficiente. Algunas otras causas incluyen lo siguiente:  Jabones y [employer].  Papel higinico de color.  Sustancias qumicas en los alimentos que se consumen.  Productos que se consumen, por ejemplo, cafena, cerveza, productos lcteos, chocolate, frutos secos, ctricos, tomates, condimentos picantes, chiles jalapeos y salsas.  Hemorroides, fisuras, infecciones y Fisher [employer].  Exceso de lavado.  Uso excesivo de laxantes.  Trastornos de la piel (psoriasis, eccema o seborrea).  Algunas enfermedades, como diabetes o problemas tiroideos.  Diarrea.  ETS (enfermedades de transmisin sexual).  Algunos tipos de cncer. En muchos de Southern Company, se desconoce la causa. La picazn suele desaparecer con tratamiento y cuidado en el hogar. Si se rasca la zona afectada puede daar ms la piel. INSTRUCCIONES PARA EL CUIDADO EN EL HOGAR Est atento a cualquier cambio en los sntomas. Tome estas medidas para Human resources officer picazn: Cuidado de la piel  Mantenga una buena higiene.  Limpie suavemente la zona anal con papel higinico, toallitas para bebs o un pao hmedo despus de cada deposicin y al acostarse.  No use jabones en la zona anal.  Seque bien la zona. Seque la zona con papel higinico o una toalla, dando golpecitos.  No frote la zona anal con ningn elemento, ni siquiera con papel higinico.  No se rasque la zona que le pica, ya que puede daarla ms e intensificar la picazn.  Tome baos de asiento en agua tibia como se lo haya indicado el mdico. Seque la zona con un pao suave luego de cada bao.  Aplquese cremas o ungentos como se lo haya indicado el mdico. Para proteger la piel, es  posible aplicar pomada de xido de zinc o una crema para mantener la humedad varias veces por da.  No use ningn producto que irrite la piel, por ejemplo, baos de burbujas, papel higinico perfumado o desodorantes ntimos. Instrucciones generales  [employer]phi de venta libre y los recetados solamente como se lo haya indicado el mdico.  Hable con el mdico sobre los suplementos de [place]. Estos son tiles para Theatre manager las heces normales si suelen ser blandas.  Use ropa interior de algodn y prendas sueltas.  Concurra a todas las visitas de control como se lo haya indicado el mdico. Esto es importante. SOLICITE ATENCIN MDICA SI:  La picazn no se alivia despus de [employer] Corporation.  La picazn aumenta.  Tiene fiebre.  La zona anal est enrojecida, se le hincha o le duele.  Observa que emana lquido, sangre o pus de la zona anal. Esta informacin no tiene Marine scientist el consejo del mdico. Asegrese de hacerle al mdico cualquier pregunta que tenga. Document Released: 06/06/2011 Document Revised: 06/23/2015 Document Reviewed: 12/28/2014 Elsevier Interactive Patient Education  2017 [employer].    IF you received an x-ray today, you will receive an invoice from [facility]. Please contact [facility] at [phone] with questions or concerns regarding your invoice.   IF you received labwork today, you will receive an invoice  from [facility]. Please contact [facility] at [phone] with questions or concerns regarding your invoice.   Our billing staff will not be able to assist you with questions regarding bills from these companies.  You will be contacted with the lab results as soon as they are available. The fastest way to get your results is to activate your My Chart account. Instructions are located on the last page of this paperwork. If you have not heard from [place] regarding the results in 2 weeks, please contact this office.

## 2016-12-16 NOTE — Progress Notes (Signed)
By signing my name below I, Tereasa Coop, attest that this documentation has been prepared under the direction and in the presence of [name], MD. Electonically Signed. Tereasa Coop, Scribe 12/16/2016 at 8:36 AM  Subjective:    Patient ID: Isabel Deleon, female    DOB: Sep 13, 1966, 51 y.o.   MRN: [id number]  Chief Complaint  Patient presents with  . Recurrent Skin Infections    In vagina    HPI Isabel Deleon is a 51 y.o. female who presents to Primary Care at [facility] complaining of vaginal skin infection. Described as a small bump in her vaginal area. Pt states this is the first time she has had a bump on her vagina. Bump first noticed 4 days ago. Also states that the day prior to noticing the bump, she shaved her pubic hair. Denies any discharge or pus from bump.   Pt also c/o intermittent episodes of perirectal pruritis that has been going on for awhile. Pt states that she has been seen and evaluated for the perirectal pruritis at primary care at [facility] and has been given creams for it in the past with no relief if symptoms.   Also c/o intermittent episodes of urinary frequency. Denies having any urinary frequency currently.   Pt reports only having intercourse with her husband. Denies any other sexual partners.   Pt spanish speaking, professional medical interpretor used. Interpretor number is [id number]. Connection was lost with the first interpretor and second interpretor was used. Second interpretor number is [id number].    Patient Active Problem List   Diagnosis Date Noted  . 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 Hyperlipidemia   . UTI (lower urinary tract infection)    Past Surgical History:  Procedure Laterality Date  . TUBAL LIGATION     No Known Allergies Prior to Admission medications   Medication Sig Start Date End Date Taking? Authorizing Provider  Multiple Vitamin (MULTIVITAMIN) tablet Take 1 tablet by mouth  daily.   Yes Historical Provider, MD  cyclobenzaprine (FLEXERIL) 10 MG tablet Take 1 tablet (10 mg total) by mouth at bedtime. For muscle spasms Patient not taking: Reported on 12/16/2016 07/29/16   Shawnee Knapp, MD  diclofenac (VOLTAREN) 75 MG EC tablet Take 1 tablet (75 mg total) by mouth 2 (two) times daily as needed for moderate pain. For pain Patient not taking: Reported on 12/16/2016 07/29/16   Shawnee Knapp, MD   Social History   Social History  . Marital status: Married    Spouse name: N/A  . Number of children: N/A  . Years of education: N/A   Occupational History  . Not on file.   Social History Main Topics  . Smoking status: Never Smoker  . Smokeless tobacco: Never Used  . Alcohol use No  . Drug use: No  . Sexual activity: Yes    Birth control/ protection: Surgical   Other Topics Concern  . Not on file   Social History Narrative  . No narrative on file      Review of Systems  Genitourinary: Negative for frequency.  Skin:       Pt is positive for perirectal prurits Pt is positive for skin bump on vaginal region.       Objective:   Physical Exam  Constitutional: She is oriented to person, place, and time. She appears well-developed and well-nourished. No distress.  HENT:  Head: Normocephalic and atraumatic.  Eyes: Conjunctivae are normal.  Pupils are equal, round, and reactive to light.  Neck: Neck supple.  Cardiovascular: Normal rate.   Pulmonary/Chest: Effort normal.  Genitourinary:  Genitourinary Comments: Left labia minora there is a 80mm indurated area of erythema, with slight induration, but no significant fluctuance. Minimal discharge in vaginal vault, no vaginal bleeding.  No perianal rash.  Musculoskeletal: Normal range of motion.  Neurological: She is alert and oriented to person, place, and time.  Skin: Skin is warm and dry.  Psychiatric: She has a normal mood and affect. Her behavior is normal.  Nursing note and vitals reviewed.    Vitals:    12/16/16 0805  BP: 104/66  Pulse: 69  Resp: 16  Temp: 99.1 F (37.3 C)  TempSrc: Oral  SpO2: 99%  Weight: 210 lb (95.3 kg)  Height: 5\' 1"  (1.549 m)        Assessment & Plan:    [name] is a 51 y.o. female Pruritus ani  - No external rash seen on exam. Potential causes discussed, and handout given for self-care at home. If symptoms persist, return to discuss other treatment or testing.  Folliculitis - Plan: Herpes simplex virus culture, doxycycline (VIBRA-TABS) 100 MG tablet  - Suspected folliculitis versus early abscess from shaving genital area. Second M.D. exam performed. Does not appear to need incision at this time.  -start doxycycline, warm soaks, RTC precautions  Language barrier  -Spanish spoken and interpreting service used as above.  Meds ordered this encounter  Medications  . doxycycline (VIBRA-TABS) 100 MG tablet    Sig: Take 1 tablet (100 mg total) by mouth 2 (two) times daily.    Dispense:  20 tablet    Refill:  0   Patient Instructions   Tome doxycycline una [employer] cada dia, y banos tipios.  si area aparece mas enchado/mas grande o empeorse - regrese a Air traffic controller.   si tiene frequencia de [place] , regrese.    Foliculitis (Folliculitis) La foliculitis es una inflamacin de los folculos capilares. La foliculitis ocurre con mayor frecuencia en el cuero cabelludo, los muslos, las piernas, la espalda y las nalgas. Sin embargo, Financial controller parte del cuerpo. CAUSAS Esta afeccin puede ser causada por lo siguiente:  Una infeccin bacteriana (frecuente).  Infecciones por hongos.  Infecciones virales.  Contacto con ciertas sustancias qumicas, especialmente aceites y alquitrn.  Rasurarse o depilarse.  Aplicacin frecuente de cremas o ungentos grasosos en la piel. La foliculitis de duracin prolongada y la foliculitis recurrente pueden ser causadas por bacterias que viven en las narinas. FACTORES DE RIESGO Es ms  probable que esta afeccin se desarrolle en las personas que tengan lo siguiente:  Un sistema inmunitario debilitado.  Diabetes.  Obesidad. SNTOMAS Los sntomas de esta afeccin incluyen lo siguiente:  Enrojecimiento.  Inflamacin.  Hinchazn.  Picazn.  Pequeos puntos blancos o amarillos llenos de pus (pstulas) que pican y aparecen sobre una zona enrojecida. Si hay una infeccin que se adentra en el folculo, puede formarse un fornculo.  Un conjunto de fornculos agrupados estrechamente (carbunclo). Estos fornculos tienden a formarse en zonas del cuerpo con mucho pelo y sudorosas. DIAGNSTICO Esta afeccin se diagnostica con un examen de la piel. Para hallar la causa de la afeccin, el mdico puede tomar una [place] de uno de los fornculos o pstulas y Personnel officer. TRATAMIENTO El tratamiento de este trastorno puede incluir lo siguiente:  La aplicacin de compresas calientes en la zona afectada.  La administracin de antibiticos o la aplicacin de un  antibitico a la piel.  La aplicacin de una solucin antisptica o darse un bao con esta solucin.  La administracin de un medicamento de venta libre para Astronomer.  La realizacin de un procedimiento para drenar las pstulas o los fornculos. Se puede realizar si una pstula o un fornculo contienen mucho pus o lquido.  La extraccin del pelo con lser. Se puede realizar para tratar una foliculitis de duracin prolongada. INSTRUCCIONES PARA EL CUIDADO EN EL HOGAR  Si se lo indican, aplique calor en la zona afectada tan frecuentemente como se lo haya indicado el mdico. Use la fuente de calor que el mdico le recomiende, como una compresa de calor hmedo o una almohadilla trmica.  Coloque una toalla entre la piel y la fuente de Freight forwarder.  Aplique el calor durante 20 a 73minutos.  Retire la fuente de calor si la piel se le pone de color rojo brillante. Esto es muy importante si no puede sentir el dolor, el  calor o el fro. Puede correr un riesgo mayor de sufrir quemaduras.  Si le recetaron un antibitico, tmelo como se lo haya indicado el mdico. No deje de usar el antibitico aunque comience a Sports administrator.  Tome los medicamentos de venta libre y los recetados solamente como se lo haya indicado el mdico.  No rasure la piel irritada.  Concurra a todas las visitas de control como se lo haya indicado el mdico. Esto es importante. SOLICITE ATENCIN MDICA DE INMEDIATO SI:  Tiene ms enrojecimiento, hinchazn o dolor en la zona afectada.  Hay lneas rojas que se extienden desde la zona afectada.  Tiene fiebre. Esta informacin no tiene Marine scientist el consejo del mdico. Asegrese de hacerle al mdico cualquier pregunta que tenga. Document Released: 10/02/2005 Document Revised: 04/02/2012 Document Reviewed: 07/23/2015 Elsevier Interactive Patient Education  2017 [place] anal (Anal Pruritus) El prurito anal es una sensacin de picazn en el ano y la piel que rodea la zona anal. Es una afeccin frecuente y puede deberse a muchos factores. Suele ocurrir cuando la zona se humedece. El origen de la humedad puede ser el sudor o una pequea cantidad de materia fecal (heces) que queda en la zona debido a una higiene personal deficiente. Algunas otras causas incluyen lo siguiente:  Jabones y [employer].  Papel higinico de color.  Sustancias qumicas en los alimentos que se consumen.  Productos que se consumen, por ejemplo, cafena, cerveza, productos lcteos, chocolate, frutos secos, ctricos, tomates, condimentos picantes, chiles jalapeos y salsas.  Hemorroides, fisuras, infecciones y [employer].  Exceso de lavado.  Uso excesivo de laxantes.  Trastornos de la piel (psoriasis, eccema o seborrea).  Algunas enfermedades, como diabetes o problemas tiroideos.  Diarrea.  ETS (enfermedades de transmisin sexual).  Algunos tipos de  cncer. En muchos de [employer], se desconoce la causa. La picazn suele desaparecer con tratamiento y cuidado en el hogar. Si se rasca la zona afectada puede daar ms la piel. INSTRUCCIONES PARA EL CUIDADO EN EL HOGAR Est atento a cualquier cambio en los sntomas. Tome estas medidas para Human resources officer picazn: Cuidado de la piel  Mantenga una buena higiene.  Limpie suavemente la zona anal con papel higinico, toallitas para bebs o un pao hmedo despus de cada deposicin y al acostarse.  No use jabones en la zona anal.  Seque bien la zona. Seque la zona con papel higinico o una toalla, dando golpecitos.  No frote la zona anal con ningn elemento,  ni siquiera con papel higinico.  No se rasque la zona que le pica, ya que puede daarla ms e intensificar la picazn.  Tome baos de asiento en agua tibia como se lo haya indicado el mdico. Seque la zona con un pao suave luego de cada bao.  Aplquese cremas o ungentos como se lo haya indicado el mdico. Para proteger la piel, es posible aplicar pomada de xido de zinc o una crema para mantener la humedad varias veces por da.  No use ningn producto que irrite la piel, por ejemplo, baos de burbujas, papel higinico perfumado o desodorantes ntimos. Instrucciones generales  [employer] de venta libre y los recetados solamente como se lo haya indicado el mdico.  Hable con el mdico sobre los suplementos de [place]. Estos son tiles para Theatre manager las heces normales si suelen ser blandas.  Use ropa interior de algodn y prendas sueltas.  Concurra a todas las visitas de control como se lo haya indicado el mdico. Esto es importante. SOLICITE ATENCIN MDICA SI:  La picazn no se alivia despus de [employer].  La picazn aumenta.  Tiene fiebre.  La zona anal est enrojecida, se le hincha o le duele.  Observa que emana lquido, sangre o pus de la zona anal. Esta informacin no tiene Marine scientist el consejo del  mdico. Asegrese de hacerle al mdico cualquier pregunta que tenga. Document Released: 06/06/2011 Document Revised: 06/23/2015 Document Reviewed: 12/28/2014 Elsevier Interactive Patient Education  2017 [employer].    IF you received an x-ray today, you will receive an invoice from [facility]. Please contact [facility] at [phone] with questions or concerns regarding your invoice.   IF you received labwork today, you will receive an invoice from [facility]. Please contact [facility] at [phone] with questions or concerns regarding your invoice.   Our billing staff will not be able to assist you with questions regarding bills from these companies.  You will be contacted with the lab results as soon as they are available. The fastest way to get your results is to activate your My Chart account. Instructions are located on the last page of this paperwork. If you have not heard from [place] regarding the results in 2 weeks, please contact this office.      I personally performed the services described in this documentation, which was scribed in my presence. The recorded information has been reviewed and considered for accuracy and completeness, addended by me as needed, and agree with information above.  Signed,   Merri Ray, MD Primary [facility] at [place].  12/16/16 8:57 AM

## 2016-12-16 NOTE — Progress Notes (Deleted)
    MRN: [id number] DOB: 1965-10-17  Subjective:   Maelin Buggy is a 51 y.o. female presenting for chief complaint of Recurrent Skin Infections (In vagina)     Pam has a current medication list which includes the following prescription(s): multivitamin, cyclobenzaprine, and diclofenac. Also has No Known Allergies.  Teryl  has a past medical history of Hyperlipidemia and UTI (lower urinary tract infection). Also  has a past surgical history that includes Tubal ligation.  Objective:   Vitals: BP 104/66   Pulse 69   Temp 99.1 F (37.3 C) (Oral)   Resp 16   Ht 5\' 1"  (1.549 m)   Wt 210 lb (95.3 kg)   SpO2 99%   BMI 39.68 kg/m   Physical Exam  No results found for this or any previous visit (from the past 24 hour(s)).  Assessment and Plan :     Jaynee Eagles, PA-C [facility] [place] [id number] 12/16/2016  8:09 AM

## 2016-12-19 LAB — HERPES SIMPLEX VIRUS CULTURE

## 2017-01-17 ENCOUNTER — Ambulatory Visit (INDEPENDENT_AMBULATORY_CARE_PROVIDER_SITE_OTHER): Payer: BC Managed Care – PPO | Admitting: Emergency Medicine

## 2017-01-17 VITALS — BP 126/84 | HR 81 | Temp 98.5°F | Resp 18 | Ht 61.0 in | Wt 208.8 lb

## 2017-01-17 DIAGNOSIS — G8929 Other chronic pain: Secondary | ICD-10-CM

## 2017-01-17 DIAGNOSIS — J069 Acute upper respiratory infection, unspecified: Secondary | ICD-10-CM | POA: Diagnosis not present

## 2017-01-17 DIAGNOSIS — M79675 Pain in left toe(s): Secondary | ICD-10-CM | POA: Diagnosis not present

## 2017-01-17 DIAGNOSIS — J029 Acute pharyngitis, unspecified: Secondary | ICD-10-CM | POA: Insufficient documentation

## 2017-01-17 DIAGNOSIS — M791 Myalgia, unspecified site: Secondary | ICD-10-CM

## 2017-01-17 DIAGNOSIS — M79674 Pain in right toe(s): Secondary | ICD-10-CM

## 2017-01-17 MED ORDER — BENZONATATE 200 MG PO CAPS: 200.0000 mg | ORAL_CAPSULE | Freq: Two times a day (BID) | ORAL | 0 refills | Status: DC | PRN

## 2017-01-17 MED ORDER — HYDROCODONE-HOMATROPINE 5-1.5 MG/5ML PO SYRP: 5.0000 mL | ORAL_SOLUTION | Freq: Three times a day (TID) | ORAL | 0 refills | Status: DC | PRN

## 2017-01-17 MED ORDER — AZITHROMYCIN 250 MG PO TABS: ORAL_TABLET | ORAL | 0 refills | Status: DC

## 2017-01-17 NOTE — Patient Instructions (Addendum)
IF you received an x-ray today, you will receive an invoice from [facility]. Please contact [facility] at [phone] with questions or concerns regarding your invoice.   IF you received labwork today, you will receive an invoice from [facility]. Please contact [facility] at [phone] with questions or concerns regarding your invoice.   Our billing staff will not be able to assist you with questions regarding bills from these companies.  You will be contacted with the lab results as soon as they are available. The fastest way to get your results is to activate your My Chart account. Instructions are located on the last page of this paperwork. If you have not heard from [place] regarding the results in 2 weeks, please contact this office.      Infeccin del tracto respiratorio superior, adultos (Upper Respiratory Infection, Adult) La mayora de las infecciones del tracto respiratorio superior estn causadas por un virus. Un infeccin del tracto respiratorio superior afecta la nariz, la garganta y las vas respiratorias superiores. El tipo ms comn de infeccin del tracto respiratorio superior es el resfro comn. [place] los medicamentos solamente como se lo haya indicado el mdico.  A fin de Best boy de garganta, haga grgaras con solucin salina templada o consuma caramelos para la tos, como se lo haya indicado el mdico.  Use un humidificador de vapor clido o inhale el vapor de la ducha para aumentar la humedad del aire. Esto facilitar la respiracin.  Beba suficiente lquido para mantener el pis (orina) claro o de color amarillo plido.  Tome sopas y caldos transparentes.  Siga una dieta saludable.  Descanse todo lo que sea necesario.  Regrese al [place] cuando la fiebre haya desaparecido o el mdico le diga que puede [place].  Es posible que deba quedarse en su casa durante un tiempo prolongado, para no transmitir la infeccin a  los dems.  Stewart usar un barbijo y lavarse las manos con frecuencia para evitar el contagio del virus.  Si tiene asma, use el inhalador con mayor frecuencia.  No consuma ningn producto que contenga tabaco, lo que incluye cigarrillos, tabaco de Higher education careers adviser o Psychologist, sport and exercise. Si necesita ayuda para dejar de fumar, consulte al mdico. SOLICITE AYUDA SI:  Siente que empeora o que no mejora.  Los medicamentos no logran [employer].  Tiene escalofros.  La dificultad para Museum/gallery exhibitions officer.  Tiene mucosidad marrn o roja.  Tiene una secrecin amarilla o marrn de la Lawyer.  Le duele la cara, especialmente al inclinarse hacia adelante.  Tiene fiebre.  Tiene los ganglios del cuello hinchados.  Siente dolor al tragar.  Tiene zonas blancas en la parte de atrs de la garganta. SOLICITE AYUDA DE INMEDIATO SI:  Los siguientes sntomas son muy intensos o constantes:  Dolor de [place].  Dolor de odos.  Dolor en la frente, detrs de los ojos y por encima de los pmulos (dolor sinusal).  Dolor en el pecho.  Tiene enfermedad pulmonar prolongada (crnica) y cualquiera de estos sntomas:  Sibilancias.  Tos prolongada.  Tos con sangre.  Cambio en la mucosidad habitual.  Presenta rigidez en el cuello.  Tiene cambios en:  La visin.  La audicin.  El pensamiento.  El [place] de nimo. ASEGRESE DE QUE:  Comprende estas instrucciones.  Controlar su afeccin.  Recibir ayuda de inmediato si no mejora o si empeora. Esta informacin no tiene Marine scientist el consejo del mdico. Asegrese de hacerle al mdico  cualquier pregunta que tenga. Document Released: 03/06/2011 Document Revised: 02/16/2015 Document Reviewed: 01/07/2014 Elsevier Interactive Patient Education  2017 [employer].

## 2017-01-17 NOTE — Progress Notes (Signed)
Isabel Deleon 51 y.o.   Chief Complaint  Patient presents with  . Cough    x1 week  . Sore Throat  . Foot Swelling    x 3 weeks     HISTORY OF PRESENT ILLNESS: This is a 51 y.o. female complaining of 1 week h/o sore throat, productive cough, and generalized achiness; also c/o chronic foot swelling/pain.  HPI   Prior to Admission medications   Medication Sig Start Date End Date Taking? Authorizing Provider  Multiple Vitamin (MULTIVITAMIN) tablet Take 1 tablet by mouth daily.   Yes Historical Provider, MD  doxycycline (VIBRA-TABS) 100 MG tablet Take 1 tablet (100 mg total) by mouth 2 (two) times daily. Patient not taking: Reported on 01/17/2017 12/16/16   Isabel Agreste, MD    No Known Allergies  Patient Active Problem List   Diagnosis Date Noted  . 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 Hyperlipidemia   . UTI (lower urinary tract infection)     Past Surgical History:  Procedure Laterality Date  . TUBAL LIGATION      Social History   Social History  . Marital status: Married    Spouse name: N/A  . Number of children: N/A  . Years of education: N/A   Occupational History  . Not on file.   Social History Main Topics  . Smoking status: Never Smoker  . Smokeless tobacco: Never Used  . Alcohol use No  . Drug use: No  . Sexual activity: Yes    Birth control/ protection: Surgical   Other Topics Concern  . Not on file   Social History Narrative  . No narrative on file    Family History  Problem Relation Age of Onset  . Hypertension Mother      Review of Systems  Constitutional: Positive for fever and malaise/fatigue.  HENT: Positive for congestion and sore throat. Negative for ear discharge, ear pain and nosebleeds.   Eyes: Negative for discharge and redness.  Respiratory: Positive for cough and sputum production. Negative for hemoptysis, shortness of breath and wheezing.   Cardiovascular: Negative for  chest pain, palpitations, leg swelling and PND.  Gastrointestinal: Negative for abdominal pain, diarrhea, nausea and vomiting.  Genitourinary: Negative for dysuria and hematuria.  Musculoskeletal: Positive for myalgias. Negative for neck pain.  Skin: Negative for rash.  Neurological: Positive for weakness. Negative for dizziness and headaches.  Endo/Heme/Allergies: Negative.   All other systems reviewed and are negative.  Vitals:   01/17/17 1431  BP: 126/84  Pulse: 81  Resp: 18  Temp: 98.5 F (36.9 C)     Physical Exam  Constitutional: She is oriented to person, place, and time. She appears well-developed and well-nourished.  HENT:  Head: Normocephalic and atraumatic.  Right Ear: External ear normal.  Left Ear: External ear normal.  Mouth/Throat: Uvula is midline. Posterior oropharyngeal erythema present. No oropharyngeal exudate.  Eyes: Conjunctivae and EOM are normal. Pupils are equal, round, and reactive to light.  Neck: Normal range of motion. Neck supple. No JVD present. No thyromegaly present.  Cardiovascular: Normal rate, regular rhythm, normal heart sounds and intact distal pulses.   Pulmonary/Chest: Effort normal and breath sounds normal.  Abdominal: Soft. Bowel sounds are normal. There is no tenderness.  Musculoskeletal: Normal range of motion.  Lymphadenopathy:    She has no cervical adenopathy.  Neurological: She is alert and oriented to person, place, and time. No sensory deficit. She exhibits normal muscle  tone.  Skin: Skin is warm and dry. Capillary refill takes less than 2 seconds. No rash noted.  Psychiatric: She has a normal mood and affect. Her behavior is normal.  Vitals reviewed.    ASSESSMENT & PLAN: Isabel Deleon was seen today for cough, sore throat and foot swelling.  Diagnoses and all orders for this visit:  Acute upper respiratory infection  Acute pharyngitis, unspecified etiology  Generalized muscle ache  Chronic pain of toes of both  feet  Other orders -     azithromycin (ZITHROMAX) 250 MG tablet; Sig as indicated -     HYDROcodone-homatropine (HYCODAN) 5-1.5 MG/5ML syrup; Take 5 mLs by mouth every 8 (eight) hours as needed for cough. -     benzonatate (TESSALON) 200 MG capsule; Take 1 capsule (200 mg total) by mouth 2 (two) times daily as needed for cough.    Patient Instructions       IF you received an x-ray today, you will receive an invoice from [facility]. Please contact [facility] at [phone] with questions or concerns regarding your invoice.   IF you received labwork today, you will receive an invoice from [place]. Please contact [facility] at [phone] with questions or concerns regarding your invoice.   Our billing staff will not be able to assist you with questions regarding bills from these companies.  You will be contacted with the lab results as soon as they are available. The fastest way to get your results is to activate your My Chart account. Instructions are located on the last page of this paperwork. If you have not heard from [place] regarding the results in 2 weeks, please contact this office.      Infeccin del tracto respiratorio superior, adultos (Upper Respiratory Infection, Adult) La mayora de las infecciones del tracto respiratorio superior estn causadas por un virus. Un infeccin del tracto respiratorio superior afecta la nariz, la garganta y las vas respiratorias superiores. El tipo ms comn de infeccin del tracto respiratorio superior es el resfro comn. [place] los medicamentos solamente como se lo haya indicado el mdico.  A fin de Best boy de garganta, haga grgaras con solucin salina templada o consuma caramelos para la tos, como se lo haya indicado el mdico.  Use un humidificador de vapor clido o inhale el vapor de la ducha para aumentar la humedad del aire. Esto facilitar la respiracin.  Beba suficiente lquido para  mantener el pis (orina) claro o de color amarillo plido.  Tome sopas y caldos transparentes.  Siga una dieta saludable.  Descanse todo lo que sea necesario.  Regrese al [place] cuando la fiebre haya desaparecido o el mdico le diga que puede [place].  Es posible que deba quedarse en su casa durante un tiempo prolongado, para no transmitir la infeccin a los dems.  Palmer usar un barbijo y lavarse las manos con frecuencia para evitar el contagio del virus.  Si tiene asma, use el inhalador con mayor frecuencia.  No consuma ningn producto que contenga tabaco, lo que incluye cigarrillos, tabaco de Higher education careers adviser o Psychologist, sport and exercise. Si necesita ayuda para dejar de fumar, consulte al mdico. SOLICITE AYUDA SI:  Siente que empeora o que no mejora.  Los medicamentos no logran [employer].  Tiene escalofros.  La dificultad para Museum/gallery exhibitions officer.  Tiene mucosidad marrn o roja.  Tiene una secrecin amarilla o marrn de la Lawyer.  Le duele la cara, especialmente al inclinarse hacia adelante.  Tiene fiebre.  Tiene los ganglios del cuello hinchados.  Siente dolor al tragar.  Tiene zonas blancas en la parte de atrs de la garganta. SOLICITE AYUDA DE INMEDIATO SI:  Los siguientes sntomas son muy intensos o constantes:  Dolor de [place].  Dolor de odos.  Dolor en la frente, detrs de los ojos y por encima de los pmulos (dolor sinusal).  Dolor en el pecho.  Tiene enfermedad pulmonar prolongada (crnica) y cualquiera de estos sntomas:  Sibilancias.  Tos prolongada.  Tos con sangre.  Cambio en la mucosidad habitual.  Presenta rigidez en el cuello.  Tiene cambios en:  La visin.  La audicin.  El pensamiento.  El [place] de nimo. ASEGRESE DE QUE:  Comprende estas instrucciones.  Controlar su afeccin.  Recibir ayuda de inmediato si no mejora o si empeora. Esta informacin no tiene Marine scientist el consejo del mdico.  Asegrese de hacerle al mdico cualquier pregunta que tenga. Document Released: 03/06/2011 Document Revised: 02/16/2015 Document Reviewed: 01/07/2014 Elsevier Interactive Patient Education  2017 Elsevier Inc.      Agustina Caroli, MD [facility]

## 2017-03-24 ENCOUNTER — Encounter: Payer: Self-pay | Admitting: Physician Assistant

## 2017-03-24 ENCOUNTER — Ambulatory Visit (INDEPENDENT_AMBULATORY_CARE_PROVIDER_SITE_OTHER): Payer: BC Managed Care – PPO | Admitting: Physician Assistant

## 2017-03-24 VITALS — BP 136/76 | HR 65 | Temp 99.0°F | Resp 17 | Ht 61.0 in | Wt 209.0 lb

## 2017-03-24 DIAGNOSIS — R300 Dysuria: Secondary | ICD-10-CM | POA: Diagnosis not present

## 2017-03-24 LAB — POC MICROSCOPIC URINALYSIS (UMFC): MUCUS RE: ABSENT

## 2017-03-24 LAB — POCT URINALYSIS DIP (MANUAL ENTRY)
Bilirubin, UA: NEGATIVE
Glucose, UA: NEGATIVE mg/dL
Ketones, POC UA: NEGATIVE mg/dL
Leukocytes, UA: NEGATIVE
NITRITE UA: NEGATIVE
PROTEIN UA: NEGATIVE mg/dL
SPEC GRAV UA: 1.015 (ref 1.010–1.025)
UROBILINOGEN UA: 0.2 U/dL
pH, UA: 5.5 (ref 5.0–8.0)

## 2017-03-24 MED ORDER — SULFAMETHOXAZOLE-TRIMETHOPRIM 800-160 MG PO TABS: 1.0000 | ORAL_TABLET | Freq: Two times a day (BID) | ORAL | 0 refills | Status: DC

## 2017-03-24 NOTE — Progress Notes (Signed)
Subjective:    Patient ID: Isabel Deleon, female    DOB: 11-14-1965, 51 y.o.   MRN: [id number]  HPI: 51 y/o F patient presents today with complaints of dysuria. She has had dysuria, low back pain, and suprapubic pain x1 week. Vaginal itch, urinary urgency, frequency and sense of having to urinate with minimla output accompany her symptoms. Malodorous urine without color change. Patinet not sleeping well due to pain and urinary frequency. Denies fever, chills, nausea, vomiting, diarrhea, and hematuria. Patient has history of UTIs.  LMP ended on 03/20/2017.   Patient Active Problem List   Diagnosis Date Noted  . Generalized muscle ache 01/17/2017  . Chronic pain of toes of both feet 01/17/2017  . 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 Hyperlipidemia   . UTI (lower urinary tract infection)    Prior to Admission medications   Medication Sig Start Date End Date Taking? Authorizing Provider  Multiple Vitamin (MULTIVITAMIN) tablet Take 1 tablet by mouth daily.   Yes [provider]  sulfamethoxazole-trimethoprim (BACTRIM DS,SEPTRA DS) 800-160 MG tablet Take 1 tablet by mouth 2 (two) times daily. 03/24/17   Harrison Mons, PA-C   No Known Allergies  Review of Systems  Constitutional: Positive for fatigue. Negative for chills and fever.  HENT: Negative.   Eyes: Negative.   Respiratory: Negative.   Cardiovascular: Negative.   Gastrointestinal: Negative for abdominal distention, abdominal pain, diarrhea, nausea and vomiting.  Endocrine: Positive for polyuria.  Genitourinary: Positive for dysuria, flank pain, frequency and urgency. Negative for hematuria and vaginal discharge.  Musculoskeletal: Negative for arthralgias.  Skin: Negative.   Neurological: Negative for dizziness, light-headedness and headaches.  Hematological: Negative.   Psychiatric/Behavioral: Positive for sleep disturbance.       Objective:   Physical Exam    Constitutional: She is oriented to person, place, and time. She appears well-developed and well-nourished. No distress.  BP 136/76   Pulse 65   Temp 99 F (37.2 C) (Oral)   Resp 17   Ht 5\' 1"  (1.549 m)   Wt 209 lb (94.8 kg)   SpO2 98%   BMI 39.49 kg/m    HENT:  Head: Normocephalic and atraumatic.  Eyes: Conjunctivae and EOM are normal. Pupils are equal, round, and reactive to light.  Neck: Normal range of motion. Neck supple.  Cardiovascular: Normal rate, regular rhythm, normal heart sounds and intact distal pulses.   Pulmonary/Chest: Effort normal and breath sounds normal.  Abdominal: Soft. Bowel sounds are normal. She exhibits no distension and no mass. There is tenderness (LLQ ) in the left lower quadrant. There is no rebound, no guarding and no CVA tenderness.  Musculoskeletal: Normal range of motion.  Ne  Lymphadenopathy:    She has no cervical adenopathy.  Neurological: She is alert and oriented to person, place, and time.  Skin: Skin is warm and dry. She is not diaphoretic.  Psychiatric: She has a normal mood and affect. Her behavior is normal. Judgment and thought content normal.       Assessment & Plan:  1. Dysuria Recurrent episodes of signs and symptoms consistent with UTI without evidence on urine dipstick or urine microscopy. Prophylactically started on Bactrim/ Culture results pending. If negative follow up with Urology.  - POCT Microscopic Urinalysis ([facility]); RESULTS: few bacteria noted. - POCT urinalysis dipstick; RESULTS: trace- lysed bacteria noted. - Urine Culture; Pending - sulfamethoxazole-trimethoprim (BACTRIM DS,SEPTRA DS) 800-160 MG tablet; Take 1 tablet by mouth 2 (two)  times daily.  Dispense: 10 tablet; Refill: 0  Return if symptoms worsen or fail to improve.

## 2017-03-24 NOTE — Patient Instructions (Signed)
     IF you received an x-ray today, you will receive an invoice from [facility]. Please contact [facility] at [phone] with questions or concerns regarding your invoice.   IF you received labwork today, you will receive an invoice from [facility]. Please contact [facility] at [phone] with questions or concerns regarding your invoice.   Our billing staff will not be able to assist you with questions regarding bills from these companies.  You will be contacted with the lab results as soon as they are available. The fastest way to get your results is to activate your My Chart account. Instructions are located on the last page of this paperwork. If you have not heard from us regarding the results in 2 weeks, please contact this office.     

## 2017-03-24 NOTE — Progress Notes (Signed)
Patient ID: Isabel Deleon, female    DOB: [birth year], 51 y.o.   MRN: [id number]  PCP: Shawnee Knapp, MD  Chief Complaint  Patient presents with  . Dysuria    Subjective:   Presents for evaluation of dysuria x 1 week.  Associated back pain and low pelvic pain, malodor, vaginal itching, increased urinary urgency and frequency. Symptoms are disrupting sleep. No fever, chills, nausea, hematuria. History of UTI, feels like this.    Review of Systems Constitutional: Positive for fatigue. Negative for chills and fever.  HENT: Negative.   Eyes: Negative.   Respiratory: Negative.   Cardiovascular: Negative.   Gastrointestinal: Negative for abdominal distention, abdominal pain, diarrhea, nausea and vomiting.  Endocrine: Positive for polyuria.  Genitourinary: Positive for dysuria, flank pain, frequency and urgency. Negative for hematuria and vaginal discharge.  Musculoskeletal: Negative for arthralgias.  Skin: Negative.   Neurological: Negative for dizziness, light-headedness and headaches.  Hematological: Negative.   Psychiatric/Behavioral: Positive for sleep disturbance.     Patient Active Problem List   Diagnosis Date Noted  . Acute upper respiratory infection 01/17/2017  . Acute pharyngitis 01/17/2017  . Generalized muscle ache 01/17/2017  . Chronic pain of toes of both feet 01/17/2017  . 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 Authorizing Provider  Multiple Vitamin (MULTIVITAMIN) tablet Take 1 tablet by mouth daily.   Yes [provider]     No Known Allergies     Objective:  Physical Exam  Constitutional: She is oriented to person, place, and time. Vital signs are normal. She appears well-developed and well-nourished. No distress.  HENT:  Head: Normocephalic and atraumatic.  Cardiovascular: Normal rate, regular rhythm and normal heart sounds.   Pulmonary/Chest: Effort  normal and breath sounds normal.  Abdominal: Soft. Normal appearance and bowel sounds are normal. She exhibits no distension and no mass. There is no hepatosplenomegaly. There is tenderness in the suprapubic area and left lower quadrant. There is no rigidity, no rebound, no guarding, no CVA tenderness, no tenderness at McBurney's point and negative Murphy's sign. No hernia.  Musculoskeletal: Normal range of motion.       Lumbar back: Normal.  Neurological: She is alert and oriented to person, place, and time.  Skin: Skin is warm and dry. No rash noted. She is not diaphoretic. No pallor.  Psychiatric: She has a normal mood and affect. Her speech is normal and behavior is normal. Judgment normal.       Results for orders placed or performed in visit on 03/24/17  POCT Microscopic Urinalysis ([facility])  Result Value Ref Range   WBC,UR,HPF,POC None None WBC/hpf   RBC,UR,HPF,POC None None RBC/hpf   Bacteria Few (A) None, Too numerous to count   Mucus Absent Absent   Epithelial Cells, UR Per Microscopy None None, Too numerous to count cells/hpf  POCT urinalysis dipstick  Result Value Ref Range   Color, UA yellow yellow   Clarity, UA clear clear   Glucose, UA negative negative mg/dL   Bilirubin, UA negative negative   Ketones, POC UA negative negative mg/dL   Spec Grav, UA 1.015 1.010 - 1.025   Blood, UA trace-lysed (A) negative   pH, UA 5.5 5.0 - 8.0   Protein Ur, POC negative negative mg/dL   Urobilinogen, UA 0.2 0.2 or 1.0 E.U./dL   Nitrite, UA Negative Negative   Leukocytes, UA Negative Negative  Assessment & Plan:   1. Dysuria Await UCx. Empiric treatment with SMX-TMP. Anticipatory guidance.  - POCT Microscopic Urinalysis ([facility]) - POCT urinalysis dipstick - Urine Culture - sulfamethoxazole-trimethoprim (BACTRIM DS,SEPTRA DS) 800-160 MG tablet; Take 1 tablet by mouth 2 (two) times daily.  Dispense: 10 tablet; Refill: 0    Return if symptoms worsen or fail to  improve.   Fara Chute, PA-C [facility] [place]

## 2017-03-25 LAB — URINE CULTURE

## 2017-05-17 ENCOUNTER — Encounter: Payer: Self-pay | Admitting: Family Medicine

## 2017-05-17 ENCOUNTER — Ambulatory Visit (INDEPENDENT_AMBULATORY_CARE_PROVIDER_SITE_OTHER): Payer: BC Managed Care – PPO | Admitting: Family Medicine

## 2017-05-17 VITALS — BP 122/81 | HR 71 | Temp 98.4°F | Resp 16 | Ht 61.0 in | Wt 215.8 lb

## 2017-05-17 DIAGNOSIS — K5909 Other constipation: Secondary | ICD-10-CM

## 2017-05-17 DIAGNOSIS — B977 Papillomavirus as the cause of diseases classified elsewhere: Secondary | ICD-10-CM

## 2017-05-17 DIAGNOSIS — K219 Gastro-esophageal reflux disease without esophagitis: Secondary | ICD-10-CM | POA: Diagnosis not present

## 2017-05-17 DIAGNOSIS — R7303 Prediabetes: Secondary | ICD-10-CM | POA: Diagnosis not present

## 2017-05-17 MED ORDER — OMEPRAZOLE 40 MG PO CPDR: 40.0000 mg | DELAYED_RELEASE_CAPSULE | Freq: Every day | ORAL | 3 refills | Status: DC

## 2017-05-17 NOTE — Patient Instructions (Addendum)
IF you received an x-ray today, you will receive an invoice from [facility]. Please contact [facility] at [phone] with questions or concerns regarding your invoice.   IF you received labwork today, you will receive an invoice from [place]. Please contact [facility] at [phone] with questions or concerns regarding your invoice.   Our billing staff will not be able to assist you with questions regarding bills from these companies.  You will be contacted with the lab results as soon as they are available. The fastest way to get your results is to activate your My Chart account. Instructions are located on the last page of this paperwork. If you have not heard from [place] regarding the results in 2 weeks, please contact this office.     Acidez estomacal (Heartburn) La acidez estomacal es un tipo de dolor o de molestia que se puede presentar en la garganta o en el pecho. A menudo se la describe como Designer, multimedia. Tambin puede producir mal aliento. La sensacin de acidez puede empeorar al [employer]son o inclinarse, y suele ser ms intensa durante la noche. La acidez puede deberse al retroceso de los contenidos estomacales hacia el esfago (reflujo). [place] estas medidas para aliviar las molestias y [place] complicaciones. Dieta  Siga la dieta que le haya recomendado el mdico, la cual puede incluir evitar alimentos y bebidas tales como: ? Caf y t (con o sin cafena). ? Bebidas que contengan alcohol. ? Bebidas energizantes y deportivas. ? Gaseosas o refrescos. ? Chocolate y cacao. ? Menta y [place]. ? Ajo y cebollas. ? Rbano picante. ? Alimentos muy condimentados y cidos, entre ellos, pimientos, [place] en polvo, curry en polvo, vinagre, salsas picantes y salsa barbacoa. ? Frutas ctricas y sus jugos, como naranjas, limones y limas. ? Alimentos a base de tomates, como salsa roja, [place], salsa y pizza con salsa  roja. ? Alimentos fritos y Radio broadcast assistant, como rosquillas, papas fritas y aderezos con alto contenido de Lobbyist. ? Carnes con alto contenido de [place], como hot dogs y cortes grasos de carnes rojas y blancas, por ejemplo, filetes de entrecot, salchicha, jamn y tocino. ? Productos lcteos con alto contenido de grasa, como [place], [place] y [place] crema.  Haga comidas pequeas y frecuentes Medical sales representative de comidas abundantes.  Evite beber [place] comidas.  No coma durante las 2 o 3horas previas a la hora de [place].  No se acueste inmediatamente despus de comer.  No haga actividad fsica enseguida despus de comer. Instrucciones generales  Est atento a cualquier cambio en los sntomas.  Tome los medicamentos de venta libre y los recetados solamente como se lo haya indicado el mdico. No tome aspirina, ibuprofeno ni otros antiinflamatorios no esteroides (AINE), a menos que se lo haya indicado el mdico.  No consuma ningn producto que contenga tabaco, lo que incluye cigarrillos, tabaco de Higher education careers adviser y Psychologist, sport and exercise. Si necesita ayuda para dejar de fumar, consulte al mdico.  Use ropas sueltas. No use prendas ajustadas alrededor de la cintura que ejerzan presin en el abdomen.  Levante (eleve) unas 6pulgadas (15centmetros) la cabecera de la cama.  Trate de reducir Schering[employer]ough de estrs con actividades como el yoga o la meditacin. Si necesita ayuda para reducir [employer]hering[employer] de estrs, consulte al mdico.  Si tiene sobrepeso, Multimedia programmer un peso saludable. Hable con el mdico acerca de su peso ideal y pdale asesoramiento en cuanto  a la dieta que debe seguir para Therapist, music.  Concurra a todas las visitas de control como se lo haya indicado el mdico. Esto es importante. SOLICITE ATENCIN MDICA SI:  Aparecen nuevos sntomas.  Baja de peso sin causa aparente.  Tiene dificultad para tragar o siente dolor al [employer]ot.  Tiene sibilancias  o tos persistente.  Los sntomas no mejoran con Dispensing optician.  Tiene acidez frecuente durante ms de DIRECTV.  [place] [employer] SI:  Tiene dolor en los brazos, el cuello, los [place], la dentadura o la espalda.  Philbert Riser, se marea o tiene sensacin de desvanecimiento.  Siente falta de aire o Tourist information centre manager.  Vomita y el vmito es parecido a la sangre o a los granos de caf.  Las heces son sanguinolentas o de color negro.  Esta informacin no tiene Marine scientist el consejo del mdico. Asegrese de hacerle al mdico cualquier pregunta que tenga. Document Released: 06/14/2011 Document Revised: 06/23/2015 Document Reviewed: 01/27/2015 Elsevier Interactive Patient Education  2017 [place] de alimentos para pacientes con reflujo gastroesofgico - Adultos (Food Choices for Gastroesophageal Reflux Disease, Adult) Cuando se tiene reflujo gastroesofgico (ERGE), los alimentos que se ingieren y los hbitos de alimentacin son Theatre stage manager. Elegir los alimentos adecuados puede ayudar a Public house manager las molestias ocasionadas por el [place]. [place]?  Elija las frutas, los vegetales, los cereales integrales, los productos lcteos, la carne de [place], de pescado y de ave con bajo contenido de grasas.  Limite las grasas, como los [place], los aderezos para [place], la [place], los frutos secos y Publishing copy.  Lleve un registro de las comidas para identificar los alimentos que ocasionan sntomas.  Evite los alimentos que le ocasionen reflujo. Pueden ser distintos para cada persona.  Haga comidas pequeas con frecuencia en lugar de tres comidas Kellogg.  Coma lentamente, en un clima distendido.  Limite el consumo de alimentos fritos.  Cocine los alimentos utilizando mtodos que no sean la fritura.  Evite el consumo alcohol.  Evite beber grandes cantidades de lquidos con las comidas.  Evite  agacharse o recostarse hasta despus de 2 o 3horas de haber comido.  QU ALIMENTOS NO SE RECOMIENDAN? Los siguientes son algunos alimentos y bebidas que pueden empeorar los sntomas: Astronomer. Jugo de tomate. Salsa de tomate y espagueti. Ajes. [place]eb[place] y [place]. Rbano picante. Frutas Naranjas, pomelos y limn (fruta y [place]). Carnes Carnes de [place], de pescado y de ave con gran contenido de grasas. Esto incluye los perros calientes, las [place], el [place], la salchicha, el salame y el tocino. Lcteos L[place]he entera y [place]. [employer]. Crema. [place]. Hel[place]. Queso crema. Bebidas Caf y t negro, con o sin cafena Bebidas gaseosas o energizantes. Condimentos Salsa picante. Salsa barbacoa. Dulces/postres Chocolate y cacao. [place]. Menta y mentol. Grasas y Unisys Corporation con alto contenido de grasas, incluidas las papas fritas. Otros Vinagre. Especias picantes, como la Solectron Corporation, la pimienta blanca, la pimienta roja, la pimienta de cayena, el curry en [place], los clavos de [place], el jengibre y el [place] en polvo. Los artculos mencionados arriba pueden no ser Dean Foods Company de las bebidas y los alimentos que se Higher education careers adviser. Comunquese con el nutricionista para recibir ms informacin. Esta informacin no tiene Marine scientist el consejo del mdico. Asegrese de hacerle al mdico cualquier pregunta que tenga. Document Released: 07/12/2005 Document Revised: 10/23/2014 Document Reviewed: 08/06/2013 Elsevier Interactive Patient Education  2017 Elsevier  Inc.  

## 2017-05-17 NOTE — Progress Notes (Signed)
Subjective:    Patient ID: Isabel Deleon, female    DOB: 1965-11-25, 51 y.o.   MRN: [id number] Chief Complaint  Patient presents with  . Abdominal Pain    states for a long time    HPI  Burning in chest. Has not tried any medicine.  Past Medical History:  Diagnosis Date  . Hyperlipidemia   . UTI (lower urinary tract infection)    Past Surgical History:  Procedure Laterality Date  . TUBAL LIGATION     Current Outpatient Prescriptions on File Prior to Visit  Medication Sig Dispense Refill  . Multiple Vitamin (MULTIVITAMIN) tablet Take 1 tablet by mouth daily.     No current facility-administered medications on file prior to visit.    No Known Allergies Family History  Problem Relation Age of Onset  . Hypertension Mother    Social History   Social History  . Marital status: Married    Spouse name: N/A  . Number of children: N/A  . Years of education: N/A   Social History Main Topics  . Smoking status: Never Smoker  . Smokeless tobacco: Never Used  . Alcohol use No  . Drug use: No  . Sexual activity: Yes    Birth control/ protection: Surgical   Other Topics Concern  . None   Social History Narrative  . None   Depression screen [facility] 2/9 05/17/2017 03/24/2017 01/17/2017 12/16/2016 07/29/2016  Decreased Interest 0 0 0 0 0  Down, Depressed, Hopeless 0 0 0 0 0  PHQ - 2 Score 0 0 0 0 0     Review of Systems See hpi    Objective:   Physical Exam  Constitutional: She is oriented to person, place, and time. She appears well-developed and well-nourished. No distress.  HENT:  Head: Normocephalic and atraumatic.  Neck: Normal range of motion. Neck supple. No thyromegaly present.  Cardiovascular: Normal rate, regular rhythm, normal heart sounds and intact distal pulses.   Pulmonary/Chest: Effort normal and breath sounds normal. No respiratory distress.  Abdominal: Soft. Bowel sounds are normal. There is no tenderness. There is no rebound, no guarding and no CVA  tenderness.  Musculoskeletal: She exhibits no edema.  Lymphadenopathy:    She has no cervical adenopathy.  Neurological: She is alert and oriented to person, place, and time.  Skin: Skin is warm and dry. She is not diaphoretic. No erythema.  Psychiatric: She has a normal mood and affect. Her behavior is normal.   BP 122/81   Pulse 71   Temp 98.4 F (36.9 C) (Oral)   Resp 16   Ht 5\' 1"  (1.549 m)   Wt 215 lb 12.8 oz (97.9 kg)   SpO2 98%   BMI 40.78 kg/m      Assessment & Plan:  Check IG Aptima HV rfx 16/18, 45 at f/u visit in 1-2 mos  1. 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 Gastroesophageal reflux disease, esophagitis presence not specified   4. Prediabetes     Orders Placed This Encounter  Procedures  . H. pylori breath test  . CBC  . Comprehensive metabolic panel  . Hemoglobin A1c  . TSH  . Care order/instruction:    AVS printed - let patient go! And labs are done inc h. Pylori test    Meds ordered this encounter  Medications  . omeprazole (PRILOSEC) 40 MG capsule    Sig: Take 1 capsule (40 mg total) by mouth daily. 30 minutes before a  meal    Dispense:  30 capsule    Refill:  3    Instructions in Spanish please    Delman Cheadle, M.D.  Primary Care at [facility] [address] [place],  [address] [phone] phone [phone] fax  05/18/17 2:10 AM

## 2017-05-18 LAB — COMPREHENSIVE METABOLIC PANEL
ALBUMIN: 4.4 g/dL (ref 3.5–5.5)
ALK PHOS: 58 IU/L (ref 39–117)
ALT: 30 IU/L (ref 0–32)
AST: 25 IU/L (ref 0–40)
Albumin/Globulin Ratio: 1.6 (ref 1.2–2.2)
BUN/Creatinine Ratio: 15 (ref 9–23)
BUN: 10 mg/dL (ref 6–24)
Bilirubin Total: <0.2 mg/dL (ref 0.0–1.2)
CALCIUM: 9.3 mg/dL (ref 8.7–10.2)
CO2: 25 mmol/L (ref 20–29)
CREATININE: 0.68 mg/dL (ref 0.57–1.00)
Chloride: 101 mmol/L (ref 96–106)
GFR calc Af Amer: 117 mL/min/{1.73_m2} (ref >59)
GFR, EST NON AFRICAN AMERICAN: 102 mL/min/{1.73_m2} (ref >59)
GLOBULIN, TOTAL: 2.7 g/dL (ref 1.5–4.5)
GLUCOSE: 98 mg/dL (ref 65–99)
Potassium: 4.2 mmol/L (ref 3.5–5.2)
SODIUM: 142 mmol/L (ref 134–144)
Total Protein: 7.1 g/dL (ref 6.0–8.5)

## 2017-05-18 LAB — CBC
HEMOGLOBIN: 13.7 g/dL (ref 11.1–15.9)
Hematocrit: 42.1 % (ref 34.0–46.6)
MCH: 29 pg (ref 26.6–33.0)
MCHC: 32.5 g/dL (ref 31.5–35.7)
MCV: 89 fL (ref 79–97)
Platelets: 317 10*3/uL (ref 150–379)
RBC: 4.73 x10E6/uL (ref 3.77–5.28)
RDW: 14.8 % (ref 12.3–15.4)
WBC: 8.4 10*3/uL (ref 3.4–10.8)

## 2017-05-18 LAB — TSH: TSH: 2.76 u[IU]/mL (ref 0.450–4.500)

## 2017-05-18 LAB — H. PYLORI BREATH TEST: H PYLORI BREATH TEST: NEGATIVE

## 2017-05-18 LAB — HEMOGLOBIN A1C
ESTIMATED AVERAGE GLUCOSE: 117 mg/dL
HEMOGLOBIN A1C: 5.7 % — AB (ref 4.8–5.6)

## 2017-08-10 ENCOUNTER — Encounter: Payer: BC Managed Care – PPO | Admitting: Family Medicine

## 2017-08-11 ENCOUNTER — Encounter: Payer: Self-pay | Admitting: Family Medicine

## 2017-08-11 ENCOUNTER — Ambulatory Visit (INDEPENDENT_AMBULATORY_CARE_PROVIDER_SITE_OTHER): Payer: BC Managed Care – PPO | Admitting: Family Medicine

## 2017-08-11 VITALS — BP 118/82 | HR 69 | Temp 97.6°F | Resp 16 | Ht 61.0 in | Wt 212.0 lb

## 2017-08-11 DIAGNOSIS — R87810 Cervical high risk human papillomavirus (HPV) DNA test positive: Secondary | ICD-10-CM

## 2017-08-11 DIAGNOSIS — R7303 Prediabetes: Secondary | ICD-10-CM | POA: Diagnosis not present

## 2017-08-11 DIAGNOSIS — Z0000 Encounter for general adult medical examination without abnormal findings: Secondary | ICD-10-CM

## 2017-08-11 DIAGNOSIS — Z1231 Encounter for screening mammogram for malignant neoplasm of breast: Secondary | ICD-10-CM

## 2017-08-11 DIAGNOSIS — K5900 Constipation, unspecified: Secondary | ICD-10-CM | POA: Diagnosis not present

## 2017-08-11 DIAGNOSIS — Z23 Encounter for immunization: Secondary | ICD-10-CM

## 2017-08-11 DIAGNOSIS — Z1239 Encounter for other screening for malignant neoplasm of breast: Secondary | ICD-10-CM

## 2017-08-11 MED ORDER — POLYETHYLENE GLYCOL 3350 17 GM/SCOOP PO POWD: 17.0000 g | Freq: Every day | ORAL | 1 refills | Status: DC

## 2017-08-11 NOTE — Patient Instructions (Addendum)
IF you received an x-ray today, you will receive an invoice from [facility]. Please contact [facility] at [phone] with questions or concerns regarding your invoice.   IF you received labwork today, you will receive an invoice from [place]. Please contact [facility] at [phone] with questions or concerns regarding your invoice.   Our billing staff will not be able to assist you with questions regarding bills from these companies.  You will be contacted with the lab results as soon as they are available. The fastest way to get your results is to activate your My Chart account. Instructions are located on the last page of this paperwork. If you have not heard from [place] regarding the results in 2 weeks, please contact this office.     hpv Prevencin de la diabetes mellitus tipo2 (Preventing Type 2 Diabetes Mellitus) La diabetes tipo2 (diabetes mellitus tipo2) es una enfermedad a largo plazo (crnica) que afecta los niveles de azcar en la sangre (glucosa). Normalmente, una hormona llamada insulina estimula el ingreso de la glucosa en las clulas del cuerpo. Las clulas usan la glucosa para Dealer. En la diabetes tipo2, puede presentarse uno de los siguientes problemas, o ambos:  El organismo no produce la cantidad suficiente de [place].  El organismo no responde de [place] a la insulina que produce (resistencia a la insulina). La resistencia a la insulina o la falta de esta hormona hace que el exceso de glucosa se acumule en la sangre, en lugar de ir a las clulas. Como consecuencia, se desarrolla glucemia alta (hiperglucemia), que puede causar muchas complicaciones. El sobrepeso o la obesidad, y Catering manager un estilo de vida inactivo (sedentario) pueden aumentar el riesgo de tener diabetes. La diabetes tipo2 se puede retardar o evitar al realizar ciertos cambios en la alimentacin y en el estilo de vida. QU CAMBIOS EN LA ALIMENTACIN SE PUEDEN  HACER?  Consuma comidas y colaciones saludables regularmente. Lleve con usted una colacin saludable para cuando tenga [employer], por [place], una fruta o un puado de frutos secos.  Coma carne [place] y protenas con bajo contenido de grasas saturadas, como pollo, pescado, huevos blancos y frijoles. Evite las carnes procesadas.  Coma mucha fruta y verdura, y Ardelia Mems cantidad importante de cereales no procesados (cereales integrales). Se recomienda que consuma lo siguiente: ? De 1 a 2tazas de frutas US [employer]. ? De 2 a 3tazas de [employer]W [employer]. ? [place] (170g) a 8onzas (227g) de cereales integrales todos los [place], como avena, [place], trigo [place], arroz integral, quinua y mijo.  Productos lcteos con bajo contenido de [place], [place], yogur y [place].  Alimentos que contengan grasas saludables, como frutos secos, Musician, aceite de [place] y aceite de canola.  Beba agua [place]. Evite bebidas que contengan ms azcar, como gaseosas y t [place].  Siga las indicaciones del mdico con respecto a las restricciones especficas para las comidas o bebidas.  Controle la cantidad de comida que consume en un momento dado (tamao de la porcin). ? Revise las etiquetas de los alimentos para conocer el tamao de la porcin. ? Utilice una balanza de cocina para pesar las cantidades de alimentos.  [place]tee o cocine al vapor los alimentos en vez de frerlos. Cocine con agua o caldo en vez de aceite o manteca.  Limite la ingesta de lo siguiente: ? Sal (sodio). No consuma ms de 1cucharadita (2400mg ) de Electrical engineer. Si tiene [place] cardiopata o hipertensin arterial,  consuma menos de  o de cucharadita (1500mg ) de Electrical engineer. ? Grasas saturadas. Es la grasa que se encuentra en estado slido a temperatura ambiente, como la [place] o la grasa de la carne. QU CAMBIOS EN EL ESTILO DE VIDA SE PUEDEN HACER? Actividad  Haga actividad fsica de  intensidad moderada durante al menos 27minutos como mnimo 5das por semana, o tanto como le haya indicado el mdico.  Pregntele al mdico qu actividades son seguras para usted. Una combinacin de actividades puede ser la mejor opcin, por ejemplo, caminar, practicar natacin, andar en bicicleta y hacer entrenamiento de fuerza.  Trate de agregar la actividad fsica a Conservation officer, nature. Por ejemplo: ? Estacione en lugares que estn ms alejados de lo habitual para poder caminar ms. Por ejemplo, estacione en una esquina alejada del estacionamiento cuando vaya a la oficina o a la tienda de comestibles. ? D una caminata durante su hora de almuerzo. ? Utilice las Clinical cytogeneticist del ascensor o de las escaleras mecnicas. Prdida de peso  Baje de peso segn se le indique. El mdico puede determinar cuntos kilos tiene que bajar y [place] a que adelgace de Geographical information systems officer segura.  Si tiene sobrepeso u obesidad, es posible que se le indique bajar, por lo menos del 5% al 7% del Engineer, site. Alcohol y tabaco  Limite el consumo de alcohol a no ms de 1 medida por da si es mujer y no est Music therapist, y 2 medidas por da si es hombre. Una medida equivale a 12onzas de cerveza, 5onzas de vino o 1onzas de bebidas alcohlicas de alta graduacin.  No consuma ningn producto que contenga tabaco, lo que incluye cigarrillos, tabaco de Higher education careers adviser y Psychologist, sport and exercise. Si necesita ayuda para dejar de fumar, consulte al [employer]. [place] con el mdico  Contrlese el nivel sanguneo de glucosa con frecuencia como se lo haya indicado el mdico.  Analice los factores de riesgo y cmo puede reducir el riesgo de tener diabetes.  Hgase las pruebas de [employer]um[employer]ident se lo haya indicado el mdico. Puede hacerse pruebas de deteccin de forma peridica, especialmente si presenta ciertos factores de riesgo para la diabetes tipo2.  Haga una cita con un especialista en alimentacin y nutricin (nutricionista certificado). Un  nutricionista certificado puede ayudarlo a preparar un plan de alimentacin saludable, y a comprender los tamaos de las porciones y las etiquetas de los alimentos. POR QU ESTOS CAMBIOS SON IMPORTANTES?  Al hacer cambios en el estilo de vida y la alimentacin, es posible prevenir o retardar la diabetes tipo2 y los problemas de salud relacionados.  Puede ser difcil reconocer los signos de la diabetes tipo2. La mejor manera de evitar los posibles daos al organismo es tomar medidas para prevenir la enfermedad antes de presentar sntomas. QU PUEDE SUCEDER SI NO SE REALIZAN CAMBIOS?  Los niveles sanguneos de glucosa pueden seguir aumentando. Es peligroso Systems analyst glucemia alta durante mucho tiempo. Demasiada glucosa en la sangre puede daar los vasos sanguneos, el corazn, los riones, los nervios y los ojos.  Puede desarrollar prediabetes o diabetes tipo2. La diabetes tipo2 puede producir muchos problemas de salud crnicos y complicaciones, por ejemplo: ? Cardiopata. ? Ictus. ? Ceguera. ? Enfermedad renal. ? Depresin. ? Mala Avery Dennison y en las piernas, que podra llevar a la extraccin quirrgica (amputacin) en casos graves. DNDE ENCONTRAR ASISTENCIA:  Pdale al mdico que le recomiende a un nutricionista certificado, a Radio broadcast assistant para el cuidado de la diabetes o un programa para Sports coach de  peso.  Busque grupos para bajar de peso locales o en lnea.  Inscrbase en un gimnasio, club de preparacin fsica o grupo de actividades al Auto[employer]ers [employer], [place] un club para salir a Writer. DNDE ENCONTRAR MS INFORMACIN: Para obtener ms informacin sobre la diabetes y la prevencin de la diabetes, visite los siguientes sitios web:  Asociacin Americana de la Diabetes (American Diabetes Association, ADA): www.diabetes.[place] Diabetes y las Enfermedades Digestivas y Renales [facility] of Diabetes and Digestive and Kidney Diseases):  FindSpin.nl Para obtener ms informacin sobre una alimentacin saludable, visite los siguientes sitios web:  Choose My Plate (MiPlato), Departamento de Agricultura de EE.UU. (U.S. Department of Agriculture, Engineer, structural): http://wiley-williams.com/  Lock[employer] Martin Dietary Guidelines (Pautas de Designer, multimedia) de la Oficina de Prevencin de Enfermedades y Promocin de Technical sales engineer (Office of Disease Prevention and Health Promotion, [place]): SurferLive.at Resumen  Puede reducir el riesgo de desarrollar diabetes tipo2 al aumentar la actividad fsica, comer alimentos saludables y Sports coach de [place], segn se le indique.  Hable con el mdico sobre el riesgo de desarrollar diabetes tipo2. Pregntele Advance Auto  de sangre o las pruebas de deteccin que deba [place]. Esta informacin no tiene Marine scientist el consejo del mdico. Asegrese de hacerle al mdico cualquier pregunta que tenga. Document Released: 11/23/2015 Document Revised: 11/23/2015 Document Reviewed: 11/23/2015 Elsevier Interactive Patient Education  2018 [place] (HPV Test) La prueba del virus del papiloma humano (VPH) se [place] para Hydrographic surveyor los tipos de infeccin por el VPH de Public affairs consultant. El VPH es un grupo de alrededor de 100 virus. Muchos de estos virus causan tumores dentro de los genitales, sobre ellos o a su alrededor. La mayora de los VPH provocan infecciones que suelen desaparecen sin tratamiento. Sin embargo, los tipos 6, 27, 14 y 54 del VPH se consideran de Public affairs consultant y pueden aumentar el riesgo de Chief Financial Officer de cuello del tero o de ano si la infeccin no se trata. La prueba del VPH identifica las cadenas de ADN (genticas) de la infeccin por el VPH, por lo que tambin se denomina prueba de [place] para Marriott. Aunque el VPH se Secretary/administrator en los hombres como en las mujeres, la prueba del VPH se [place] solo para Hydrographic surveyor un mayor riesgo de cncer en las  mujeres:  Con una prueba de Papanicolaou anormal.  Despus del tratamiento de una prueba de Papanicolaou anormal.  Entre 30y 65aos.  Despus del tratamiento de una infeccin por el VPH de United Parcel. La prueba del VPH se puede hacer al mismo tiempo que un examen plvico y Ardelia Mems prueba de Papanicolaou en mujeres de ms de 30 aos. Tanto la prueba del VPH como la prueba de Papanicolaou requieren Truddie Coco de clulas del cuello del tero. PREPARACIN PARA EL ESTUDIO  No se haga lavados vaginales ni se d un bao durante 24 a 48 horas antes de la prueba o como se lo haya indicado el mdico.  No tenga sexo durante 24 a 48 horas antes de la prueba o como se lo haya indicado el mdico.  Es posible que se le pida que reprograme la prueba si est menstruando.  Se le pedir que orine antes de la prueba.  RESULTADOS Es su responsabilidad retirar el resultado del [place]. Consulte en el laboratorio o en el departamento en el que fue realizado el estudio cundo y cmo podr The TJX Companies. Hable con el mdico si tiene Good[employer] Tire. El resultado ser  negativo o positivo. Significado de los Microsoft de la prueba Un resultado negativo de la prueba del VPH significa que no se detect el VPH, y es muy probable que no tenga el virus. Significado de los Avon Products de la prueba Un resultado positivo de la prueba del VPH indica que tiene el virus.  Si el resultado de la prueba [place] la presencia de [place] cadena del VPH de alto riesgo, puede tener mayor riesgo de Chief Financial Officer de cuello del tero o de ano si la infeccin no se trata.  Si se encuentran cadenas del VPH de bajo riesgo, es poco probable que tenga un alto riesgo de Chief Financial Officer. Hable con el mdico MetLife. El mdico The TJX Companies para Optometrist un diagnstico y Teacher, adult education un plan de tratamiento adecuado para usted. Esta informacin no tiene Hydrologist el consejo del mdico. Asegrese de hacerle al mdico cualquier pregunta que tenga. Document Released: 01/18/2009 Document Revised: 10/23/2014 Document Reviewed: 02/17/2014 Elsevier Interactive Patient Education  2017 Reynolds American.

## 2017-08-11 NOTE — Progress Notes (Signed)
10/27/20182:29 PM  Isabel Deleon 31-Mar-1966, 51 y.o. female [id number]  Chief Complaint  Patient presents with  . Annual Exam  . Gynecologic Exam    HPI:   Patient is a 51 y.o. female with past medical history significant for neg pap but pos HR HPV in 2017 which she reports being unaware of who presents today for repeat pap/annual.  She otherwise reports has not had a mammogram in several years.  She reports a colonoscopy order by previous PCP, was told repeat in 2020, chronic constipation, does not take anything  G6P5 Started menarche around age 32 or maybe later Menses are regular, but getting a bit heavier Having mild hot flashes H/o recurrent UTIs, seen by urology, per patient nothing concerning, was given "pills" did not take, does not know what they were for.  Does not remember being told she was pre-diabetic.  Depression screen [facility] 2/9 08/11/2017 05/17/2017 03/24/2017  Decreased Interest 0 0 0  Down, Depressed, Hopeless 0 0 0  PHQ - 2 Score 0 0 0    No Known Allergies  Prior to Admission medications   Medication Sig Start Date End Date Taking? Authorizing Provider  Multiple Vitamin (MULTIVITAMIN) tablet Take 1 tablet by mouth daily.   Yes [provider]  omeprazole (PRILOSEC) 40 MG capsule Take 1 capsule (40 mg total) by mouth daily. 30 minutes before a meal Patient not taking: Reported on 08/11/2017 05/17/17   Shawnee Knapp, MD    Past Medical History:  Diagnosis Date  . Hyperlipidemia   . UTI (lower urinary tract infection)     Past Surgical History:  Procedure Laterality Date  . TUBAL LIGATION      Social History  Substance Use Topics  . Smoking status: Never Smoker  . Smokeless tobacco: Never Used  . Alcohol use No    Family History  Problem Relation Age of Onset  . Hypertension Mother     Review of Systems  Constitutional: Negative for chills, fever, malaise/fatigue and weight loss.  Respiratory: Negative for cough and shortness  of breath.   Cardiovascular: Negative for chest pain, palpitations and leg swelling.  Gastrointestinal: Negative for abdominal pain, blood in stool, melena, nausea and vomiting.  Genitourinary: Positive for urgency. Negative for dysuria and hematuria.  Musculoskeletal: Positive for back pain, joint pain and myalgias.  Neurological: Negative for dizziness, tingling and focal weakness.     OBJECTIVE:  Blood pressure 118/82, pulse 69, temperature 97.6 F (36.4 C), temperature source Oral, resp. rate 16, height 5\' 1"  (1.549 m), weight 212 lb (96.2 kg), last menstrual period 07/25/2017, SpO2 99 %.  Physical Exam  Constitutional: She is oriented to person, place, and time and well-developed, well-nourished, and in no distress.  HENT:  Head: Normocephalic and atraumatic.  Right Ear: Hearing, tympanic membrane, external ear and ear canal normal.  Left Ear: Hearing, tympanic membrane, external ear and ear canal normal.  Mouth/Throat: Oropharynx is clear and moist.  Eyes: Pupils are equal, round, and reactive to light. EOM are normal.  Neck: Neck supple. No thyromegaly present.  Cardiovascular: Normal rate, regular rhythm, normal heart sounds and intact distal pulses.  Exam reveals no gallop and no friction rub.   No murmur heard. Pulmonary/Chest: Effort normal and breath sounds normal. She has no wheezes. She has no rales. Right breast exhibits no inverted nipple, no mass, no nipple discharge, no skin change and no tenderness. Left breast exhibits inverted nipple. Left breast exhibits no mass, no nipple discharge, no skin  change and no tenderness.  Abdominal: Soft. Bowel sounds are normal. She exhibits no distension and no mass. There is no tenderness.  Genitourinary: Vagina normal, uterus normal, cervix normal, right adnexa normal, left adnexa normal and vulva normal. Vagina exhibits no rugosity. No vaginal discharge found.  Genitourinary Comments: Mildly atrophic mucosa  Musculoskeletal: Normal  range of motion. She exhibits no edema.  Lymphadenopathy:    She has no cervical adenopathy.    She has no axillary adenopathy.       Right: No supraclavicular adenopathy present.       Left: No supraclavicular adenopathy present.  Neurological: She is alert and oriented to person, place, and time. She has normal reflexes. Gait normal.  Skin: Skin is warm and dry.  Psychiatric: Mood and affect normal.  Nursing note and vitals reviewed.   ASSESSMENT and PLAN  1. Annual physical exam HCM reviewed/discussed. Anticipatory guidance regarding healthy weight, lifestyle and choices given.   2. Need for prophylactic vaccination and inoculation against influenza - Flu Vaccine QUAD 36+ mos IM - given today  3. Cervical high risk HPV (human papillomavirus) test positive Discussed diagnosis, concerns, next steps. FU pending results.  - Pap IG and HPV (high risk) DNA detection.   4. Pre-diabetes Discussed diagnosis, importance of low carb diet, regular exercise and healthy weight.   5. Breast cancer screening - MM DIGITAL SCREENING BILATERAL; Future  6. Constipation, unspecified constipation type - polyethylene glycol powder (GLYCOLAX/MIRALAX) powder; Take 17 g by mouth daily.  Return in about 4 weeks (around 09/08/2017).    Rutherford Guys, MD Primary Care at [place], [place] [address] Ph.  [phone] Fax [phone]

## 2017-08-16 ENCOUNTER — Telehealth: Payer: Self-pay

## 2017-08-16 LAB — PAP IG AND HPV HIGH-RISK
HPV, high-risk: POSITIVE — AB
PAP Smear Comment: 0

## 2017-08-16 NOTE — Telephone Encounter (Signed)
I did not see her last visit, in fact I haven't ordered any lab tests. I recommend you contact provider who ordered them.

## 2017-08-16 NOTE — Telephone Encounter (Signed)
Pt is requesting lab results from last visit. Please provide recommendation.

## 2017-08-20 NOTE — Telephone Encounter (Signed)
Please advise 

## 2017-08-21 ENCOUNTER — Telehealth: Payer: Self-pay | Admitting: Family Medicine

## 2017-08-21 DIAGNOSIS — B977 Papillomavirus as the cause of diseases classified elsewhere: Secondary | ICD-10-CM

## 2017-08-21 NOTE — Telephone Encounter (Signed)
Discussed abnormal pap results with patient, referral to gyn. Possible colposcopy. All questions answered. Patient in agreement with plan.

## 2017-08-29 ENCOUNTER — Telehealth: Payer: Self-pay | Admitting: Obstetrics and Gynecology

## 2017-08-29 NOTE — Telephone Encounter (Signed)
Called and left a message for patient to call back to schedule a new patient doctor referral appointment with our office for an abnormal pap/HPV typing.

## 2017-09-05 ENCOUNTER — Encounter: Payer: Self-pay | Admitting: Obstetrics and Gynecology

## 2017-09-05 ENCOUNTER — Ambulatory Visit: Payer: BC Managed Care – PPO | Admitting: Obstetrics and Gynecology

## 2017-09-05 ENCOUNTER — Other Ambulatory Visit: Payer: Self-pay

## 2017-09-05 VITALS — BP 118/78 | HR 80 | Resp 16 | Ht 60.5 in | Wt 209.0 lb

## 2017-09-05 DIAGNOSIS — B977 Papillomavirus as the cause of diseases classified elsewhere: Secondary | ICD-10-CM

## 2017-09-05 DIAGNOSIS — L292 Pruritus vulvae: Secondary | ICD-10-CM

## 2017-09-05 NOTE — Progress Notes (Signed)
51 y.o. 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 The pap's have been negative for SIL. The patient is here with an interpreter.  She c/o recurrent, intermittent vulvar irritation for a long time. Only in one area on the left vulva.   Period Duration (Days): 4 Period Pattern: Regular Menstrual Flow: Moderate, Heavy Menstrual Control: Maxi pad, Thin pad Menstrual Control Change Freq (Hours): changes pad every 3 hours  Dysmenorrhea: (!) Severe Dysmenorrhea Symptoms: Cramping  Patient's last menstrual period was 08/12/2017.          Sexually active: Yes.    The current method of family planning is tubal ligation.    Exercising: No.  The patient does not participate in regular exercise at present. Smoker:  no  Health Maintenance: Pap:  08-11-17 WNL + HR HPV History of abnormal Pap:  yes MMG:  07-25-11 WNL  Colonoscopy:  2016 polyps   BMD:   Never TDaP:  unsure Gardasil: N/A   reports that  has never smoked. she has never used smokeless tobacco. She reports that she does not drink alcohol or use drugs.  Past Medical History:  Diagnosis Date  . Hyperlipidemia   . UTI (lower urinary tract infection)     Past Surgical History:  Procedure Laterality Date  . TUBAL LIGATION      Current Outpatient Medications  Medication Sig Dispense Refill  . Multiple Vitamin (MULTIVITAMIN) tablet Take 1 tablet by mouth daily.    . polyethylene glycol powder (GLYCOLAX/MIRALAX) powder Take 17 g by mouth daily. 3350 g 1   No current facility-administered medications for this visit.     Family History  Problem Relation Age of Onset  . Hypertension Mother     Review of Systems  Constitutional: Negative.   HENT: Negative.   Eyes: Negative.   Respiratory: Negative.   Cardiovascular: Negative.   Gastrointestinal: Negative.   Endocrine: Negative.   Genitourinary: Negative.   Musculoskeletal: Negative.   Skin: Negative.    Allergic/Immunologic: Negative.   Neurological: Negative.   Psychiatric/Behavioral: Negative.     Exam:   BP 118/78 (BP Location: Right Arm, Patient Position: Sitting, Cuff Size: Large)   Pulse 80   Resp 16   Ht 5' 0.5" (1.537 m)   Wt 209 lb (94.8 kg)   LMP 08/12/2017   BMI 40.15 kg/m   Weight change: @WEIGHTCHANGE @ Height:   Height: 5' 0.5" (153.7 cm)  Ht Readings from Last 3 Encounters:  09/05/17 5' 0.5" (1.537 m)  08/11/17 5\' 1"  (1.549 m)  05/17/17 5\' 1"  (1.549 m)    General appearance: alert, cooperative and appears stated age   Pelvic: External genitalia:  no lesions, no focal changes in the area of pruritus              Urethra:  normal appearing urethra with no masses, tenderness or lesions              Bartholins and Skenes: normal                 Vagina: normal appearing vagina with normal color and discharge, no lesions              Cervix: no lesions                Colposcopy: unsatisfactory. Minimal aceto-white changes at 10 o'clock, biopsy taken. ECC done. Negative lugols examination of the cervix and upper vagina.    Chaperone  was present for exam.  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 If symptoms persist she should return  CC: Grant Fontana, MD Note sent

## 2017-09-05 NOTE — Patient Instructions (Signed)

## 2017-09-06 LAB — VAGINITIS/VAGINOSIS, DNA PROBE
CANDIDA SPECIES: NEGATIVE
Gardnerella vaginalis: NEGATIVE
Trichomonas vaginosis: NEGATIVE

## 2017-09-10 ENCOUNTER — Telehealth: Payer: Self-pay | Admitting: Obstetrics and Gynecology

## 2017-09-10 NOTE — Telephone Encounter (Signed)
I have not called this patient

## 2017-09-10 NOTE — Telephone Encounter (Signed)
Patient returned call to [facility] about results. She requested a call back with an interpreter. I returned her call and she said she was calling about results.

## 2017-09-10 NOTE — Telephone Encounter (Signed)
Spoke with patient using pacific interpreter ID [phone]. Advised as seen below per Dr. Talbert Nan. Patient states she has her doubts about the hpv testing results. States has been with spouse for 27 years, recent spouse "testing" results today showed nothing. Explained hpv, patient states she still has her doubts. Recommended OV for further discussion of results with Dr. Talbert Nan. Patient request to schedule OV the week of 10/08/17. OV scheduled for 12/26 at 1:45pm with Dr. Talbert Nan. Patient is agreeable to date and time.   Routing to provider for final review. Patient is agreeable to disposition. Will close encounter.   Notes recorded by Salvadore Dom, MD on 09/09/2017 at 1:11 PM EST Please let the patient know that her colposcopy biopsy returned showing some atypia, cw hpv, but no dysplasia. Her vaginitis panel was negative for infection. She should have a f/u pap and hpv test with her primary in one year and should return if she has any abnormal finding (including ASCUS or +HPV). The patient only speaks Spanish, you will need an interpreter.   CC: Dr Grant Fontana

## 2017-09-10 NOTE — Telephone Encounter (Signed)
Forwarding to triage since Margaretha Sheffield did not call patient. Will need a Spanish interpreter to assist when calling patient back.

## 2017-10-10 ENCOUNTER — Other Ambulatory Visit: Payer: Self-pay

## 2017-10-10 ENCOUNTER — Encounter: Payer: Self-pay | Admitting: Obstetrics and Gynecology

## 2017-10-10 ENCOUNTER — Ambulatory Visit: Payer: BC Managed Care – PPO | Admitting: Obstetrics and Gynecology

## 2017-10-10 VITALS — BP 128/80 | HR 72 | Resp 16 | Wt 217.0 lb

## 2017-10-10 DIAGNOSIS — R102 Pelvic and perineal pain: Secondary | ICD-10-CM | POA: Diagnosis not present

## 2017-10-10 DIAGNOSIS — N898 Other specified noninflammatory disorders of vagina: Secondary | ICD-10-CM | POA: Diagnosis not present

## 2017-10-10 DIAGNOSIS — K5900 Constipation, unspecified: Secondary | ICD-10-CM | POA: Diagnosis not present

## 2017-10-10 DIAGNOSIS — M545 Low back pain: Secondary | ICD-10-CM | POA: Diagnosis not present

## 2017-10-10 DIAGNOSIS — N949 Unspecified condition associated with female genital organs and menstrual cycle: Secondary | ICD-10-CM | POA: Diagnosis not present

## 2017-10-10 DIAGNOSIS — R300 Dysuria: Secondary | ICD-10-CM

## 2017-10-10 DIAGNOSIS — R109 Unspecified abdominal pain: Secondary | ICD-10-CM | POA: Diagnosis not present

## 2017-10-10 DIAGNOSIS — G8929 Other chronic pain: Secondary | ICD-10-CM

## 2017-10-10 DIAGNOSIS — R195 Other fecal abnormalities: Secondary | ICD-10-CM | POA: Diagnosis not present

## 2017-10-10 LAB — POC HEMOCCULT BLD/STL (OFFICE/1-CARD/DIAGNOSTIC): FECAL OCCULT BLD: NEGATIVE

## 2017-10-10 LAB — POCT URINALYSIS DIPSTICK
APPEARANCE: NORMAL
Bilirubin, UA: NEGATIVE
Blood, UA: NEGATIVE
GLUCOSE UA: NEGATIVE
Ketones, UA: NEGATIVE
LEUKOCYTES UA: NEGATIVE
Nitrite, UA: NEGATIVE
PH UA: 6 (ref 5.0–8.0)
Protein, UA: NEGATIVE
Urobilinogen, UA: NEGATIVE E.U./dL — AB

## 2017-10-10 MED ORDER — IBUPROFEN 800 MG PO TABS: 800.0000 mg | ORAL_TABLET | Freq: Three times a day (TID) | ORAL | 1 refills | Status: DC | PRN

## 2017-10-10 NOTE — Progress Notes (Signed)
GYNECOLOGY  VISIT   HPI: 51 y.o.   Married  Hispanic  female   [phone] with Patient's last menstrual period was 08/12/2017.   Cycles are irregular.  here to discuss colposcopy results The patient's last 2 paps have been negative with +HPV. Her colposcopy directed biopsy returned with hpv changes, but no distinct dysplasia.  She has issues with her colon, she strains to have BM. She has a BM if she drinks tea daily. Otherwise BM every other day.  She c/o a long h/o intermittent abdominal/pelvic pain. Always has pain when she goes to the bathroom, can be up 3-4 x a night to void.  She has been treated with antibiotics for her pain with voiding in the past and it has helped some.   Sometimes accumulate urine, sometimes has trouble voiding. She has gone to the MD to evaluate her urinary c/o. Saw a Dealer. Was told she was okay.  She has some vaginal d/c with an odor.  She has mucous from her colon as well. When she sits for a long time she gets pain in her coccyx.   GYNECOLOGIC HISTORY: Patient's last menstrual period was 08/12/2017. Contraception:Tubal Ligation  Menopausal hormone therapy: none         OB History    Gravida Para Term Preterm AB Living   6 5 5   1 5    SAB TAB Ectopic Multiple Live Births   1                 Patient Active Problem List   Diagnosis Date Noted  . Pre-diabetes 08/11/2017  . Generalized muscle ache 01/17/2017  . Chronic pain of toes of both feet 01/17/2017  . 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 Hyperlipidemia   . UTI (lower urinary tract infection)     Past Surgical History:  Procedure Laterality Date  . TUBAL LIGATION      Current Outpatient Medications  Medication Sig Dispense Refill  . Multiple Vitamin (MULTIVITAMIN) tablet Take 1 tablet by mouth daily.    . polyethylene glycol powder (GLYCOLAX/MIRALAX) powder Take 17 g by mouth daily. 3350 g 1   No current facility-administered medications  for this visit.      ALLERGIES: Patient has no known allergies.  Family History  Problem Relation Age of Onset  . Hypertension Mother     Social History   Socioeconomic History  . Marital status: Married    Spouse name: Not on file  . Number of children: Not on file  . Years of education: Not on file  . Highest education level: Not on file  Social Needs  . Financial resource strain: Not on file  . Food insecurity - worry: Not on file  . Food insecurity - inability: Not on file  . Transportation needs - medical: Not on file  . Transportation needs - non-medical: Not on file  Occupational History  . Not on file  Tobacco Use  . Smoking status: Never Smoker  . Smokeless tobacco: Never Used  Substance and Sexual Activity  . Alcohol use: No  . Drug use: No  . Sexual activity: Yes    Partners: Male    Birth control/protection: Surgical  Other Topics Concern  . Not on file  Social History Narrative  . Not on file    Review of Systems  Constitutional: Negative.   HENT: Negative.   Eyes: Negative.   Respiratory: Negative.   Cardiovascular:  Negative.   Gastrointestinal: Negative.   Genitourinary: Negative.   Musculoskeletal: Negative.   Skin: Negative.   Neurological: Negative.   Endo/Heme/Allergies: Negative.   Psychiatric/Behavioral: Negative.     PHYSICAL EXAMINATION:    BP 128/80 (BP Location: Right Arm, Patient Position: Sitting, Cuff Size: Large)   Pulse 72   Resp 16   Wt 217 lb (98.4 kg)   LMP 08/12/2017   BMI 41.68 kg/m     General appearance: alert, cooperative and appears stated age Abdomen: soft, non-tender; non distended,  no masses,  no organomegaly Extremities: normal, atraumatic, no cyanosis Lymph: normal inguinal nodes  Pelvic: normal external genitalia, normal urethral meatus, normal bartholin's, normal vaginal mucosa, no abnormal d/c. Cervix without lesions, +/- uterine tenderness. Uterus is anteverted, mobile, not appreciably  enlarged. Bladder is tender to palpation. No pelvic floor tenderness. RV: as above Stool guiac negative    ASSESSMENT Patient here to discuss HPV infection. Pain with voiding, long term, some frequency, nocturia.  Constipation, she self treats with "tea" Pelvic/abdominal pain, +/- uterine tenderness Bladder tender Mucous in her stool. Reports normal colonoscopy 2 years ago Vaginal discharge Lower mid back pain    PLAN Pap with +HPV for the last 2 years. Cervical biopsy with HPV effect. Discussed HPV infection Return for GYN ultrasound Will try to get a copy of her Urology evaluation Genprobe Urine for ua, c&s Affirm Ibuprofen for pain   An After Visit Summary was printed and given to the patient.  Over 40 minutes face to face time of which over 50% was spent in counseling.   Conversation via interpreter via skype. [place], [place] The patient is a poor historian even with the interpreter.  CC: Delman Cheadle, MD

## 2017-10-11 ENCOUNTER — Other Ambulatory Visit: Payer: Self-pay

## 2017-10-11 ENCOUNTER — Ambulatory Visit: Payer: BC Managed Care – PPO | Admitting: Obstetrics and Gynecology

## 2017-10-11 ENCOUNTER — Encounter: Payer: Self-pay | Admitting: Obstetrics and Gynecology

## 2017-10-11 ENCOUNTER — Ambulatory Visit (INDEPENDENT_AMBULATORY_CARE_PROVIDER_SITE_OTHER): Payer: BC Managed Care – PPO

## 2017-10-11 VITALS — BP 124/80 | HR 72 | Resp 16 | Wt 217.0 lb

## 2017-10-11 DIAGNOSIS — R109 Unspecified abdominal pain: Secondary | ICD-10-CM | POA: Diagnosis not present

## 2017-10-11 DIAGNOSIS — N914 Secondary oligomenorrhea: Secondary | ICD-10-CM | POA: Diagnosis not present

## 2017-10-11 DIAGNOSIS — R102 Pelvic and perineal pain: Secondary | ICD-10-CM

## 2017-10-11 LAB — VAGINITIS/VAGINOSIS, DNA PROBE
CANDIDA SPECIES: NEGATIVE
Gardnerella vaginalis: NEGATIVE
TRICHOMONAS VAG: NEGATIVE

## 2017-10-11 LAB — URINE CULTURE

## 2017-10-11 LAB — URINALYSIS, MICROSCOPIC ONLY
Bacteria, UA: NONE SEEN
Casts: NONE SEEN /lpf

## 2017-10-11 LAB — POCT URINE PREGNANCY: PREG TEST UR: NEGATIVE

## 2017-10-11 MED ORDER — MEDROXYPROGESTERONE ACETATE 5 MG PO TABS: ORAL_TABLET | ORAL | 6 refills | Status: DC

## 2017-10-11 NOTE — Progress Notes (Signed)
GYNECOLOGY  VISIT   HPI: 51 y.o.   Married  Hispanic  female   [phone] with Patient's last menstrual period was 08/12/2017.   here for follow up pelvic pain. See detailed note from yesterday. The patient's LMP was on 08/12/17. No bleeding since then. Up until October she was having cycles every month x 4 days. She would change her pad approximately every 3 hours. No breakthrough bleeding, moderate dysmenorrhea.      GYNECOLOGIC HISTORY: Patient's last menstrual period was 08/12/2017. Contraception:tubal ligation  Menopausal hormone therapy: none         OB History    Gravida Para Term Preterm AB Living   6 5 5   1 5    SAB TAB Ectopic Multiple Live Births   1                 Patient Active Problem List   Diagnosis Date Noted  . Pre-diabetes 08/11/2017  . Generalized muscle ache 01/17/2017  . Chronic pain of toes of both feet 01/17/2017  . 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 Hyperlipidemia   . UTI (lower urinary tract infection)     Past Surgical History:  Procedure Laterality Date  . TUBAL LIGATION      Current Outpatient Medications  Medication Sig Dispense Refill  . ibuprofen (ADVIL,MOTRIN) 800 MG tablet Take 1 tablet (800 mg total) by mouth every 8 (eight) hours as needed. 30 tablet 1  . Multiple Vitamin (MULTIVITAMIN) tablet Take 1 tablet by mouth daily.    . polyethylene glycol powder (GLYCOLAX/MIRALAX) powder Take 17 g by mouth daily. 3350 g 1  . medroxyPROGESTERone (PROVERA) 5 MG tablet Take one tablet a day for 5 days now and every other month if no spontaneous menses. 5 tablet 6   No current facility-administered medications for this visit.      ALLERGIES: Patient has no known allergies.  Family History  Problem Relation Age of Onset  . Hypertension Mother     Social History   Socioeconomic History  . Marital status: Married    Spouse name: Not on file  . Number of children: Not on file  . Years  of education: Not on file  . Highest education level: Not on file  Social Needs  . Financial resource strain: Not on file  . Food insecurity - worry: Not on file  . Food insecurity - inability: Not on file  . Transportation needs - medical: Not on file  . Transportation needs - non-medical: Not on file  Occupational History  . Not on file  Tobacco Use  . Smoking status: Never Smoker  . Smokeless tobacco: Never Used  Substance and Sexual Activity  . Alcohol use: No  . Drug use: No  . Sexual activity: Yes    Partners: Male    Birth control/protection: Surgical  Other Topics Concern  . Not on file  Social History Narrative  . Not on file    Review of Systems  Constitutional: Negative.   HENT: Negative.   Eyes: Negative.   Respiratory: Negative.   Cardiovascular: Negative.   Gastrointestinal: Negative.   Genitourinary:       Pelvic pain  Musculoskeletal: Negative.   Skin: Negative.   Neurological: Negative.   Endo/Heme/Allergies: Negative.   Psychiatric/Behavioral: Negative.     PHYSICAL EXAMINATION:    BP 124/80 (BP Location: Right Arm, Patient Position: Sitting, Cuff Size: Large)   Pulse  72   Resp 16   Wt 217 lb (98.4 kg)   LMP 08/12/2017   BMI 41.68 kg/m     General appearance: alert, cooperative and appears stated age  Ultrasound reviewed. Significant for a thickened endometrium, otherwise normal.   ASSESSMENT Abdominal pelvic pain, bowel and bladder c/o. No gyn findings on ultrasound to explain her pain (not cyclic) Thickened endometrium, c/w anovulation.  Vaginitis panel and urinalysis from yesterday were normal Urine culture and genprobe are pending    PLAN UPT negative (tubal for contraception) Will treat with cyclic provera Will review Urology notes and get back to the patient with a plan She will need to f/u with GI or primary for her bowel complaints     An After Visit Summary was printed and given to the patient.  Addendum: urology notes  received and reviewed. She saw Dr Alyson Ingles in 9/17. She was given a script for vesicare 5 mg daily and was instructed to return in 4-6 weeks.  Will notify patient and find out if she tried the medication. I think she needs to go back to see Dr Alyson Ingles.  CC: Dr Alyson Ingles, Dr Laqueta Carina, [place] functions as the interpreter.

## 2017-10-12 LAB — GC/CHLAMYDIA PROBE AMP
CHLAMYDIA, DNA PROBE: NEGATIVE
Neisseria gonorrhoeae by PCR: NEGATIVE

## 2018-02-01 ENCOUNTER — Ambulatory Visit: Payer: BC Managed Care – PPO | Admitting: Family Medicine

## 2018-02-01 ENCOUNTER — Encounter: Payer: Self-pay | Admitting: Family Medicine

## 2018-02-01 ENCOUNTER — Other Ambulatory Visit: Payer: Self-pay

## 2018-02-01 VITALS — BP 120/82 | HR 77 | Temp 98.0°F | Resp 16 | Ht 61.0 in | Wt 212.8 lb

## 2018-02-01 DIAGNOSIS — R109 Unspecified abdominal pain: Secondary | ICD-10-CM

## 2018-02-01 DIAGNOSIS — N898 Other specified noninflammatory disorders of vagina: Secondary | ICD-10-CM

## 2018-02-01 LAB — POCT URINALYSIS DIP (MANUAL ENTRY)
Bilirubin, UA: NEGATIVE
Blood, UA: NEGATIVE
Glucose, UA: NEGATIVE mg/dL
Ketones, POC UA: NEGATIVE mg/dL
Leukocytes, UA: NEGATIVE
Nitrite, UA: NEGATIVE
Protein Ur, POC: NEGATIVE mg/dL
Spec Grav, UA: 1.025 (ref 1.010–1.025)
Urobilinogen, UA: 0.2 E.U./dL
pH, UA: 5.5 (ref 5.0–8.0)

## 2018-02-01 MED ORDER — CLOTRIMAZOLE 1 % VA CREA: 1.0000 | TOPICAL_CREAM | Freq: Every day | VAGINAL | 0 refills | Status: DC

## 2018-02-01 NOTE — Patient Instructions (Signed)
     IF you received an x-ray today, you will receive an invoice from [facility]. Please contact [facility] at [phone] with questions or concerns regarding your invoice.   IF you received labwork today, you will receive an invoice from [facility]. Please contact [facility] at [phone] with questions or concerns regarding your invoice.   Our billing staff will not be able to assist you with questions regarding bills from these companies.  You will be contacted with the lab results as soon as they are available. The fastest way to get your results is to activate your My Chart account. Instructions are located on the last page of this paperwork. If you have not heard from us regarding the results in 2 weeks, please contact this office.     

## 2018-02-01 NOTE — Progress Notes (Signed)
4/19/201911:11 AM  Isabel Deleon Mar 03, 1966, 52 y.o. female [id number]  Chief Complaint  Patient presents with  . vaginal irritiation    x 2 wks  . Back Pain    lower x 2wks    HPI:   Patient is a 52 y.o. female  who presents today for concerns of 2 weeks of right labial vaginal irritation and low back pain, she denies any dysuria, hematuria, vaginal discharge, pelvic pain. She denies any h/o vaginal lesions. She has been using OTC monistat and ibuprofen with partial relief. She denies any vaginal douching, new soaps or detergents.   Depression screen [facility] 2/9 02/01/2018 08/11/2017 05/17/2017  Decreased Interest 0 0 0  Down, Depressed, Hopeless 0 0 0  PHQ - 2 Score 0 0 0    No Known Allergies  Prior to Admission medications   Medication Sig Start Date End Date Taking? Authorizing Provider  ibuprofen (ADVIL,MOTRIN) 800 MG tablet Take 1 tablet (800 mg total) by mouth every 8 (eight) hours as needed. 10/10/17  Yes Salvadore Dom, MD  Multiple Vitamin (MULTIVITAMIN) tablet Take 1 tablet by mouth daily.   Yes [provider]  medroxyPROGESTERone (PROVERA) 5 MG tablet Take one tablet a day for 5 days now and every other month if no spontaneous menses. Patient not taking: Reported on 02/01/2018 10/11/17   Salvadore Dom, MD  polyethylene glycol powder [facility]) powder Take 17 g by mouth daily. Patient not taking: Reported on 02/01/2018 08/11/17   Rutherford Guys, MD    Past Medical History:  Diagnosis Date  . Hyperlipidemia   . UTI (lower urinary tract infection)     Past Surgical History:  Procedure Laterality Date  . TUBAL LIGATION      Social History   Tobacco Use  . Smoking status: Never Smoker  . Smokeless tobacco: Never Used  Substance Use Topics  . Alcohol use: No    Family History  Problem Relation Age of Onset  . Hypertension Mother     ROS Per hpi  OBJECTIVE:  Blood pressure 120/82, pulse 77, temperature 98 F (36.7  C), temperature source Oral, resp. rate 16, height 5\' 1"  (1.549 m), weight 212 lb 12.8 oz (96.5 kg), last menstrual period 01/24/2018, SpO2 97 %.  Physical Exam Gen: aaox3, nad Back: FROM, no spinal TTP, + bilateral paraspinal TTP along waist line. No CVAT GU: normal external genitalia and vagina, no lesions, rashes, ulcers, erythema noted. Scant thin watery discharge. Cervix without lesions or discharge.    Results for orders placed or performed in visit on 02/01/18 (from the past 24 hour(s))  POCT urinalysis dipstick     Status: Normal   Collection Time: 02/01/18 11:25 AM  Result Value Ref Range   Color, UA yellow yellow   Clarity, UA clear clear   Glucose, UA negative negative mg/dL   Bilirubin, UA negative negative   Ketones, POC UA negative negative mg/dL   Spec Grav, UA 1.025 1.010 - 1.025   Blood, UA negative negative   pH, UA 5.5 5.0 - 8.0   Protein Ur, POC negative negative mg/dL   Urobilinogen, UA 0.2 0.2 or 1.0 E.U./dL   Nitrite, UA Negative Negative   Leukocytes, UA Negative Negative    ASSESSMENT and PLAN 1. Flank pain Normal ua. Discussed cont OTC ibuprofen prn. Discussed conservative measures with heat/ice and gentle stretching.  - POCT urinalysis dipstick  2. Vaginal itching Reassured patient of normal exam. Advised sitz baths, use of non-fragance soaps/detergents. complete  treatment with OTC antifungal. RTC precautions discussed.  - WET PREP FOR [place], YEAST, CLUE  Other orders - clotrimazole (GYNE-LOTRIMIN) 1 % vaginal cream; Place 1 Applicatorful vaginally at bedtime.  Return if symptoms worsen or fail to improve.    Rutherford Guys, MD Primary Care at [facility] [place], [place] [address] Ph.  [phone] Fax [phone]

## 2018-02-02 LAB — WET PREP FOR TRICH, YEAST, CLUE
Clue Cell Exam: NEGATIVE
Trichomonas Exam: NEGATIVE
Yeast Exam: NEGATIVE

## 2018-02-04 ENCOUNTER — Encounter: Payer: Self-pay | Admitting: Family Medicine

## 2018-03-29 ENCOUNTER — Ambulatory Visit: Payer: BC Managed Care – PPO | Admitting: Family Medicine

## 2018-03-29 ENCOUNTER — Encounter: Payer: Self-pay | Admitting: Family Medicine

## 2018-03-29 ENCOUNTER — Other Ambulatory Visit: Payer: Self-pay

## 2018-03-29 VITALS — BP 136/82 | HR 77 | Temp 98.2°F | Ht 61.5 in | Wt 218.2 lb

## 2018-03-29 DIAGNOSIS — Z8619 Personal history of other infectious and parasitic diseases: Secondary | ICD-10-CM

## 2018-03-29 DIAGNOSIS — R1013 Epigastric pain: Secondary | ICD-10-CM

## 2018-03-29 MED ORDER — OMEPRAZOLE 20 MG PO CPDR: 20.0000 mg | DELAYED_RELEASE_CAPSULE | Freq: Every day | ORAL | 0 refills | Status: DC

## 2018-03-29 NOTE — Patient Instructions (Signed)
     IF you received an x-ray today, you will receive an invoice from [facility]. Please contact [facility] at [phone] with questions or concerns regarding your invoice.   IF you received labwork today, you will receive an invoice from [facility]. Please contact [facility] at [phone] with questions or concerns regarding your invoice.   Our billing staff will not be able to assist you with questions regarding bills from these companies.  You will be contacted with the lab results as soon as they are available. The fastest way to get your results is to activate your My Chart account. Instructions are located on the last page of this paperwork. If you have not heard from us regarding the results in 2 weeks, please contact this office.     

## 2018-03-29 NOTE — Progress Notes (Signed)
6/14/201912:35 PM  Isabel Deleon [birth year], 52 y.o. female [id number]  Chief Complaint  Patient presents with  . Abdominal Pain    Gas pain, constipation    HPI:   Patient is a 52 y.o. female with past medical history significant for previous h pylori infection about 4 years ago who presents today for 3 weeks of epigastric pain worse with eating and at night.   It is significant enough that it has woken her up from sleep on multiple occassions She reports bloating, if does not radiate She denies any nausea, vomiting, melena, hematochezia She denies any early satiety, weight loss She denies any fever or chills  She reports that current sx remind her of previous infection and is requesting being tested again   Fall Risk  03/29/2018 03/29/2018 02/01/2018 08/11/2017 05/17/2017  Falls in the past year? No No No No No     Depression screen [facility] 2/9 03/29/2018 03/29/2018 02/01/2018  Decreased Interest 0 0 0  Down, Depressed, Hopeless 0 0 0  PHQ - 2 Score 0 0 0    No Known Allergies  Prior to Admission medications   Medication Sig Start Date End Date Taking? Authorizing Provider  ibuprofen (ADVIL,MOTRIN) 800 MG tablet Take 1 tablet (800 mg total) by mouth every 8 (eight) hours as needed. 10/10/17   Salvadore Dom, MD  Multiple Vitamin (MULTIVITAMIN) tablet Take 1 tablet by mouth daily.    [provider]    Past Medical History:  Diagnosis Date  . Hyperlipidemia   . UTI (lower urinary tract infection)     Past Surgical History:  Procedure Laterality Date  . TUBAL LIGATION      Social History   Tobacco Use  . Smoking status: Never Smoker  . Smokeless tobacco: Never Used  Substance Use Topics  . Alcohol use: No    Family History  Problem Relation Age of Onset  . Hypertension Mother     ROS Per hpi  OBJECTIVE:  Blood pressure 136/82, pulse 77, temperature 98.2 F (36.8 C), temperature source Oral, height 5' 1.5" (1.562 m), weight 218 lb 3.2  oz (99 kg), last menstrual period 03/25/2018, SpO2 97 %.  Physical Exam  Constitutional: She is oriented to person, place, and time. She appears well-developed and well-nourished.  HENT:  Head: Normocephalic and atraumatic.  Mouth/Throat: Oropharynx is clear and moist. No oropharyngeal exudate.  Eyes: Pupils are equal, round, and reactive to light. EOM are normal. No scleral icterus.  Neck: Neck supple.  Cardiovascular: Normal rate, regular rhythm and normal heart sounds. Exam reveals no gallop and no friction rub.  No murmur heard. Pulmonary/Chest: Effort normal and breath sounds normal. She has no wheezes. She has no rales.  Abdominal: Soft. Bowel sounds are normal. There is no hepatosplenomegaly. There is tenderness in the epigastric area. There is no rebound, no guarding and negative Murphy's sign.  Musculoskeletal: She exhibits no edema.  Neurological: She is alert and oriented to person, place, and time.  Skin: Skin is warm and dry.  Psychiatric: She has a normal mood and affect.  Nursing note and vitals reviewed.   ASSESSMENT and PLAN  1. Abdominal pain, epigastric 2. History of Helicobacter pylori infection - H. pylori breath test  Other orders - omeprazole (PRILOSEC) 20 MG capsule; Take 1 capsule (20 mg total) by mouth at bedtime.  Return if symptoms worsen or fail to improve.    Rutherford Guys, MD Primary Care at [facility] [place], [place] [address]  Ph.  [phone] Fax [phone]

## 2018-03-30 LAB — H. PYLORI BREATH TEST: H pylori Breath Test: NEGATIVE

## 2018-03-31 ENCOUNTER — Encounter: Payer: Self-pay | Admitting: Family Medicine

## 2018-04-04 ENCOUNTER — Telehealth: Payer: Self-pay | Admitting: Family Medicine

## 2018-04-04 NOTE — Telephone Encounter (Signed)
Copied from [place] [phone]. Topic: Quick Communication - See Telephone Encounter >> Apr 04, 2018 10:43 AM Rutherford Nail, NT wrote: CRM for notification. See Telephone encounter for: 04/04/18. Interpreter line used. Patient calling in regards to her pap smear results from 02/01/18 and results from her visit on 03/29/18. Please advise. CB#: [phone]

## 2018-04-06 ENCOUNTER — Telehealth: Payer: Self-pay | Admitting: Family Medicine

## 2018-04-06 NOTE — Telephone Encounter (Signed)
Pt. Calling (with daughter as Optometrist) to inform Dr. Pamella Pert that the issue she was seen for on 03/29/18 has persisted through medication. Pt. Is seeking advice about what to do next, particularly if new medication will be prescribed or if she will need to come in.

## 2018-04-06 NOTE — Telephone Encounter (Signed)
Pt message sent to Dr. Pamella Pert - symptoms persisting

## 2018-04-08 MED ORDER — SUCRALFATE 1 GM/10ML PO SUSP: 1.0000 g | Freq: Three times a day (TID) | ORAL | 0 refills | Status: DC

## 2018-04-08 MED ORDER — OMEPRAZOLE 20 MG PO CPDR: 20.0000 mg | DELAYED_RELEASE_CAPSULE | Freq: Two times a day (BID) | ORAL | 0 refills | Status: DC

## 2018-04-08 NOTE — Telephone Encounter (Signed)
Please let them know that I increased her omeprazole to twice a day, also sent in a prescription for liquid carafate in case increase in medication is not enough. She is not improving with above measures she needs to be re-evaluated. thanks

## 2018-04-09 NOTE — Telephone Encounter (Signed)
Patient was informed. Voiced understanding 

## 2018-05-17 ENCOUNTER — Ambulatory Visit: Payer: BC Managed Care – PPO | Admitting: Family Medicine

## 2018-08-10 ENCOUNTER — Other Ambulatory Visit: Payer: Self-pay

## 2018-08-10 ENCOUNTER — Encounter: Payer: Self-pay | Admitting: Physician Assistant

## 2018-08-10 ENCOUNTER — Ambulatory Visit: Payer: BC Managed Care – PPO | Admitting: Physician Assistant

## 2018-08-10 VITALS — BP 123/76 | HR 67 | Temp 98.0°F | Ht 61.0 in | Wt 219.0 lb

## 2018-08-10 DIAGNOSIS — B977 Papillomavirus as the cause of diseases classified elsewhere: Secondary | ICD-10-CM

## 2018-08-10 DIAGNOSIS — K5909 Other constipation: Secondary | ICD-10-CM | POA: Diagnosis not present

## 2018-08-10 NOTE — Progress Notes (Signed)
Isabel Deleon  MRN: [id number] DOB: [birth year]  Subjective:  Isabel Deleon is a 52 y.o. G6P5 female seen in office today for a chief complaint of follow up on abnormal pap.   Previous paps:  06/17/16: normal cytology, +HPV 08/11/18: normal cytology, +HPV  Followed with gyn: had colposcopy last year. Was told to repeat in one year.   Per note of Dr. Talbert Nan on 09/10/17:  "Please let the patient know that her colposcopy biopsy returned showing some atypia, cw hpv, but no dysplasia. Her vaginitis panel was negative for infection. She should have a f/u pap and hpv test with her primary in one year and should return if she has any abnormal finding (including ASCUS or +HPV). The patient only speaks Spanish, you will need an interpreter."  Sexually active with monogamous partner.  No concern for STDs.  [employer] interpreter used.   Review of Systems  Constitutional: Negative for chills, diaphoresis and fever.  Gastrointestinal: Positive for constipation (has been dealing with this on and off for a while). Negative for abdominal distention, abdominal pain, diarrhea, nausea and vomiting.  Genitourinary: Negative for dyspareunia, dysuria, frequency, genital sores, urgency, vaginal bleeding and vaginal discharge.  Neurological: Negative for dizziness and light-headedness.    Patient Active Problem List   Diagnosis Date Noted  . Pre-diabetes 08/11/2017  . Generalized muscle ache 01/17/2017  . Chronic pain of toes of both feet 01/17/2017  . 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 ibuprofen (ADVIL,MOTRIN) 800 MG tablet Take 1 tablet (800 mg total) by mouth every 8 (eight) hours as needed. 30 tablet 1  . Multiple Vitamin (MULTIVITAMIN) tablet Take 1 tablet by mouth daily.    Marland Kitchen omeprazole (PRILOSEC) 20 MG capsule Take 1 capsule (20 mg total) by mouth 2 (two) times daily before a meal. (Patient not  taking: Reported on 08/10/2018) 60 capsule 0  . sucralfate (CARAFATE) 1 GM/10ML suspension Take 10 mLs (1 g total) by mouth 4 (four) times daily -  with meals and at bedtime. (Patient not taking: Reported on 08/10/2018) 420 mL 0   No current facility-administered medications on file prior to visit.     No Known Allergies   Objective:  BP 123/76 (BP Location: Left Arm, Patient Position: Sitting, Cuff Size: Large)   Pulse 67   Temp 98 F (36.7 C) (Oral)   Ht 5\' 1"  (1.549 m)   Wt 219 lb (99.3 kg)   SpO2 98%   BMI 41.38 kg/m   Physical Exam  Constitutional: She is oriented to person, place, and time. She appears well-developed and well-nourished. No distress.  HENT:  Head: Normocephalic and atraumatic.  Eyes: Conjunctivae are normal.  Neck: Normal range of motion.  Pulmonary/Chest: Effort normal.  Genitourinary: Vagina normal and uterus normal. Uterus is not deviated, not enlarged, not fixed and not tender. Cervix exhibits no motion tenderness, no discharge and no friability. Right adnexum displays no mass, no tenderness and no fullness. Left adnexum displays no mass, no tenderness and no fullness. No erythema, tenderness or bleeding in the vagina. No vaginal discharge found.  Genitourinary Comments: CMA chaperone present for exam.  Neurological: She is alert and oriented to person, place, and time.  Skin: Skin is warm and dry.  Psychiatric: She has a normal mood and affect.  Vitals reviewed.   Assessment and Plan :  1. High risk HPV infection Pap smear pending.  If abnormal, patient  should follow-up with gynecology.  Will contact patient with results and discuss further plan. - Pap IG and HPV (high risk) DNA detection  2. Chronic constipation Given educational material on lifestyle modifications and over-the-counter medications to help with chronic constipation.    Tenna Delaine PA-C  [facility] at [facility] 08/10/2018 8:17 AM

## 2018-08-10 NOTE — Patient Instructions (Addendum)
Hemos recogido una prueba de Papanicolaou hoy. Si es anormal, deber hacer un seguimiento con su gineclogo. Nos comunicaremos con usted con los [employer].  Senna Docusate Primera Semana - tome 1 pastilla dos vezes al dia [place] - tome 1 pastilla una vez al dia o 1/2 pastilla dos vezes al dia Mike Craze - tome 1/2 pastilla una vez al dia   If you have lab work done today you will be contacted with your lab results within the next 2 weeks.  If you have not heard from [place] then please contact us. The fastest way to get your results is to register for My Chart.   Estreimiento en los adultos Constipation, Adult Se llama estreimiento cuando:  Tiene deposiciones (defeca) una menor cantidad de veces a la semana de lo normal.  Tiene dificultad para defecar.  Las heces son secas y duras o son ms grandes que lo normal.  Siga estas indicaciones en su casa: Comida y bebida   Consuma alimentos con alto contenido de [place], por ejemplo: ? Lambert Mody y verduras frescas. ? Cereales integrales. ? Frijoles.  Consuma una menor cantidad de alimentos ricos en grasas, con bajo contenido de [place] o excesivamente procesados, como: ? Papas fritas. ? Hamburguesas. ? Galletas. ? Caramelos. ? Gaseosas.  Beba suficiente lquido para mantener el pis (orina) claro o de color amarillo plido. Instrucciones generales  Haga actividad fsica con regularidad o segn las indicaciones del mdico.  Vaya al bao cuando sienta la necesidad de defecar. No se aguante las ganas.  Tome los medicamentos de venta libre y los recetados solamente como se lo haya indicado el mdico. Estos incluyen los suplementos de [place].  Realice ejercicios de reentrenamiento del suelo plvico, como: ? Respirar profundamente mientras relaja la parte inferior del vientre (abdomen). ? Relajar el suelo plvico mientras defeca.  Controle su afeccin para ver si hay cambios.  Concurra a todas las visitas de  control como se lo haya indicado el mdico. Esto es importante. Comunquese con un mdico si:  Siente un dolor que empeora.  Tiene fiebre.  No ha defecado por 4das.  Vomita.  No tiene hambre.  Pierde peso.  Tiene una hemorragia en el ano.  Las deposiciones Media planner) son delgadas como un lpiz. Solicite ayuda de inmediato si:  Jaclynn Guarneri, y los sntomas empeoran de repente.  Tiene prdida de materia fecal u observa [employer].  Siente el vientre ms duro o ms grande de lo normal (est hinchado).  Siente un dolor muy intenso en el vientre.  Se siente mareado o se desmaya. Esta informacin no tiene Marine scientist el consejo del mdico. Asegrese de hacerle al mdico cualquier pregunta que tenga. Document Released: 11/04/2010 Document Revised: 01/03/2017 Document Reviewed: 03/22/2016 Elsevier Interactive Patient Education  2018 [employer].  IF you received an x-ray today, you will receive an invoice from [facility] Radiology. Please contact [facility] Radiology at [phone] with questions or concerns regarding your invoice.   IF you received labwork today, you will receive an invoice from [facility]. Please contact [facility] at [phone] with questions or concerns regarding your invoice.   Our billing staff will not be able to assist you with questions regarding bills from these companies.  You will be contacted with the lab results as soon as they are available. The fastest way to get your results is to activate your My Chart account. Instructions are located on the last page of this paperwork. If you have not heard from  [place] regarding the results in 2 weeks, please contact this office.

## 2018-08-14 LAB — PAP IG AND HPV HIGH-RISK
HPV, HIGH-RISK: NEGATIVE
PAP Smear Comment: 0

## 2018-09-04 ENCOUNTER — Encounter: Payer: Self-pay | Admitting: Physician Assistant

## 2018-09-04 NOTE — Progress Notes (Signed)
Lab letter has been sent via mail to patients home address 

## 2018-09-06 ENCOUNTER — Ambulatory Visit: Payer: Self-pay

## 2018-09-06 NOTE — Telephone Encounter (Signed)
Patient called in with c/o "back pain." Triaged using [place] ID [phone]. She says "I had a pap smear on 08/10/18 and was told I would have some light pink discharge afterwards. I had that pinkish fluid, but now it is bleeding today and I have been having low back pain since the pap smear off and on pain. The pain comes and is a 10, it's sharp, stabbing pain that stops me from walking, I have to hold on to something." I asked about neurologic symptoms, she says "I feel by bladder hurts since the pap smear, lower abdominal pain." I asked about urinary symptoms, she says "I have a little burning and I go to the bathroom frequently." According to protocol, see PCP within 3 days, patient asked to be seen tomorrow. No availability with PCP, appointment scheduled for tomorrow at 1140 with Dr. Nolon Rod, care advice given, patient verbalized understanding.  Reason for Disposition . [1] MODERATE back pain (e.g., interferes with normal activities) AND [2] present > 3 days  Answer Assessment - Initial Assessment Questions 1. ONSET: "When did the pain begin?"      Past couple of weeks 2. LOCATION: "Where does it hurt?" (upper, mid or lower back)     Lower back on the right side 3. SEVERITY: "How bad is the pain?"  (e.g., Scale 1-10; mild, moderate, or severe)   - MILD (1-3): doesn't interfere with normal activities    - MODERATE (4-7): interferes with normal activities or awakens from sleep    - SEVERE (8-10): excruciating pain, unable to do any normal activities      10 when it comes; no pain now. Sharp and stabbing that keeps me from walking 4. PATTERN: "Is the pain constant?" (e.g., yes, no; constant, intermittent)      Off and on pain 5. RADIATION: "Does the pain shoot into your legs or elsewhere?"     No 6. CAUSE:  "What do you think is causing the back pain?"      I don't know 7. BACK OVERUSE:  "Any recent lifting of heavy objects, strenuous work or exercise?"     No 8. MEDICATIONS:  "What have you taken so far for the pain?" (e.g., nothing, acetaminophen, NSAIDS)     Advil 9. NEUROLOGIC SYMPTOMS: "Do you have any weakness, numbness, or problems with bowel/bladder control?"     No, but I feel like my bladder hurts since the pap smear 10. OTHER SYMPTOMS: "Do you have any other symptoms?" (e.g., fever, abdominal pain, burning with urination, blood in urine)       Lower abdominal pain and go to bathroom frequently to urination, a little burning 11. PREGNANCY: "Is there any chance you are pregnant?" (e.g., yes, no; LMP)       No  Protocols used: BACK PAIN-A-AH

## 2018-09-07 ENCOUNTER — Ambulatory Visit: Payer: Self-pay | Admitting: Family Medicine

## 2018-10-05 ENCOUNTER — Other Ambulatory Visit: Payer: Self-pay

## 2018-10-05 ENCOUNTER — Encounter: Payer: Self-pay | Admitting: Family Medicine

## 2018-10-05 ENCOUNTER — Ambulatory Visit: Payer: BC Managed Care – PPO | Admitting: Family Medicine

## 2018-10-05 VITALS — BP 131/80 | HR 71 | Temp 98.5°F | Ht 61.0 in | Wt 221.4 lb

## 2018-10-05 DIAGNOSIS — S46911A Strain of unspecified muscle, fascia and tendon at shoulder and upper arm level, right arm, initial encounter: Secondary | ICD-10-CM

## 2018-10-05 MED ORDER — PREDNISONE 10 MG PO TABS: 50.0000 mg | ORAL_TABLET | Freq: Every day | ORAL | 0 refills | Status: AC

## 2018-10-05 MED ORDER — MELOXICAM 15 MG PO TABS
15.0000 mg | ORAL_TABLET | Freq: Every day | ORAL | 0 refills | Status: DC | PRN
Start: 2018-10-05 — End: 2018-12-28

## 2018-10-05 MED ORDER — DICLOFENAC SODIUM 1 % TD GEL: 4.0000 g | Freq: Four times a day (QID) | TRANSDERMAL | 1 refills | Status: DC

## 2018-10-05 NOTE — Progress Notes (Signed)
Patient ID: Isabel Deleon, female    DOB: 05/06/1966, 52 y.o.   MRN: [id number]  PCP: Isabel Guys, MD  Chief Complaint  Patient presents with  . Arm Pain    in the right arm, woks as a Chartered certified accountant, does alot of heavy lifting. Also having pain in the knees and back    Subjective:  HPI Isabel Deleon is a 52 y.o. female presents for evaluation right lower elbow and arm pain. Right arm pain began at her elbow and radiates down to her fingers. No injury to arm or paresthesias. Right hand weakness. She has taken over the counter ibuprofen without relief. When arm is immobile pain improves. She has noticed mild swelling around the upper forearm region. She works a occupation that requires continuous lifting and use of arm and shoulder muscles.  Social History   Socioeconomic History  . Marital status: Married    Spouse name: Not on file  . Number of children: Not on file  . Years of education: Not on file  . Highest education level: Not on file  Occupational History  . Not on file  Social Needs  . Financial resource strain: Not on file  . Food insecurity:    Worry: Not on file    Inability: Not on file  . Transportation needs:    Medical: Not on file    Non-medical: Not on file  Tobacco Use  . Smoking status: Never Smoker  . Smokeless tobacco: Never Used  Substance and Sexual Activity  . Alcohol use: No  . Drug use: No  . Sexual activity: Yes    Partners: Male    Birth control/protection: Surgical  Lifestyle  . Physical activity:    Days per week: Not on file    Minutes per session: Not on file  . Stress: Not on file  Relationships  . Social connections:    Talks on phone: Not on file    Gets together: Not on file    Attends religious service: Not on file    Active member of club or organization: Not on file    Attends meetings of clubs or organizations: Not on file    Relationship status: Not on file  . Intimate partner violence:    Fear of current or ex  partner: Not on file    Emotionally abused: Not on file    Physically abused: Not on file    Forced sexual activity: Not on file  Other Topics Concern  . Not on file  Social History Narrative  . Not on file    Family History  Problem Relation Age of Onset  . Hypertension Mother    Review of Systems Pertinent negatives listed in HPI Patient Active Problem List   Diagnosis Date Noted  . Pre-diabetes 08/11/2017  . Generalized muscle ache 01/17/2017  . Chronic pain of toes of both feet 01/17/2017  . 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 Authorizing Provider  ibuprofen (ADVIL,MOTRIN) 800 MG tablet Take 1 tablet (800 mg total) by mouth every 8 (eight) hours as needed. 10/10/17  Yes Salvadore Dom, MD  Multiple Vitamin (MULTIVITAMIN) tablet Take 1 tablet by mouth daily.   Yes [provider]    Past Medical, Surgical Family and Social History reviewed and updated.    Objective:   Today's Vitals   10/05/18 0855  BP: 131/80  Pulse:  71  Temp: 98.5 F (36.9 C)  TempSrc: Oral  SpO2: 98%  Weight: 221 lb 6.4 oz (100.4 kg)  Height: 5\' 1"  (1.549 m)    Wt Readings from Last 3 Encounters:  10/05/18 221 lb 6.4 oz (100.4 kg)  08/10/18 219 lb (99.3 kg)  03/29/18 218 lb 3.2 oz (99 kg)     Physical Exam General appearance: alert, well developed, well nourished, cooperative and in no distress Head: Normocephalic, without obvious abnormality, atraumatic Respiratory: Respirations even and unlabored, normal respiratory rate Heart: rate and rhythm normal. No gallop or murmurs noted on exam  Extremities: No gross deformities Skin: Skin color, texture, turgor normal. No rashes seen  Psych: Appropriate mood and affect. Neurologic: Mental status: Alert, oriented to person, place, and time, thought content appropriate.   Assessment & Plan:  1. Muscle strain of right upper  arm, initial encounter See medications as follows: if no improvement recommend, referral to orthopedics for evaluation of joint injection.   Meds ordered this encounter  Medications  . diclofenac sodium (VOLTAREN) 1 % GEL    Sig: Apply 4 g topically 4 (four) times daily.    Dispense:  100 g    Refill:  1  . predniSONE (DELTASONE) 10 MG tablet    Sig: Take 5 tablets (50 mg total) by mouth daily with breakfast for 5 days.    Dispense:  25 tablet    Refill:  0  . meloxicam (MOBIC) 15 MG tablet    Sig: Take 1 tablet (15 mg total) by mouth daily as needed for pain.    Dispense:  30 tablet    Refill:  0    Start only after Meloxicam is completed        -The patient was given clear instructions to go to ER or return to medical center if symptoms do not improve, worsen or new problems develop. The patient verbalized understanding.     Carroll Sage. Kenton Kingfisher, FNP-C Nurse Practitioner (PRN Staff)  Primary Care at [facility] [address] [place], [place]

## 2018-10-05 NOTE — Patient Instructions (Addendum)
If you have lab work done today you will be contacted with your lab results within the next 2 weeks.  If you have not heard from [place] then please contact us. The fastest way to get your results is to register for My Chart.   IF you received an x-ray today, you will receive an invoice from [facility] Radiology. Please contact [facility] Radiology at [phone] with questions or concerns regarding your invoice.   IF you received labwork today, you will receive an invoice from [facility]. Please contact [facility] at [phone] with questions or concerns regarding your invoice.   Our billing staff will not be able to assist you with questions regarding bills from these companies.  You will be contacted with the lab results as soon as they are available. The fastest way to get your results is to activate your My Chart account. Instructions are located on the last page of this paperwork. If you have not heard from [place] regarding the results in 2 weeks, please contact this office.         Distensin muscular Muscle Strain Una distensin muscular es una lesin que se produce cuando un msculo se estira ms all de su largo normal. Puede ocurrir durante cadas, mientras se practican deportes o se levantan objetos. A causa de una distensin, pueden rasgarse las fibras musculares. En general, la recuperacin de una distensin muscular tarda de 1 a 2semanas. La recuperacin completa normalmente tarda de 5 a 6semanas. Inicialmente, se trata con terapia PRICE (proteccin, reposo, hielo, compresin, elevacin). Esto implica:  Proteger al msculo para que no sufra nuevas lesiones.  Dejar en reposo el msculo lesionado.  Aplicar hielo en el msculo lesionado.  Aplicar presin (compresin) en el msculo lesionado. Esto se puede hacer con una frula o una venda elstica.  Levantar (elevar) el msculo lesionado. Adems, el mdico puede recomendarle analgsicos. Siga estas indicaciones en su  casa: Si tiene una frula:  Use la frula segn las indicaciones del mdico. Qutesela solamente como se lo haya indicado el mdico.  Afloje la frula si los dedos de la mano o de los pies se le adormecen, si siente hormigueo o si se le enfran y se tornan de Optician, dispensing.  Mantenga la frula limpia.  Si la frula no es impermeable: ? No deje que se moje. ? Cbrala con un envoltorio hermtico cuando tome un bao de inmersin o [place]. Control del dolor, la rigidez y la hinchazn   Si se lo indican, aplique hielo en el lugar de la lesin. ? Si tiene una frula desmontable, qutesela como se lo haya indicado el mdico. ? Ponga el hielo en una bolsa plstica. ? Coloque una [employer] piel y la bolsa de hielo. ? Coloque el hielo durante 45minutos, 2 a 3veces por da.  Mueva los dedos de las manos o de los pies con Camera operator. Esto ayuda a English as a second language teacher entumecimiento y Air cabin crew.  Cuando est sentado o acostado, mantenga el lugar lesionado por encima del nivel del corazn.  Use una venda elstica segn las indicaciones del mdico. Asegrese de no ajustarla demasiado. Instrucciones generales  [employer] de venta libre y los recetados solamente como se lo haya indicado el mdico.  Limite las [place]. Deje en reposo el msculo lesionado como se lo haya indicado el mdico. El mdico puede decirle que los movimientos suaves no representan un problema.  Si le indicaron fisioterapia, haga los ejercicios como se lo haya indicado el mdico.  No ejerza presin en ninguna parte de la frula hasta que se haya endurecido por completo. Esto puede tardar muchas horas.  No consuma ningn producto que contenga nicotina o tabaco, como cigarrillos y Psychologist, sport and exercise. Estos pueden retrasar la consolidacin del [place]. Si necesita ayuda para dejar de fumar, consulte al mdico.  Entre en calor antes de hacer ejercicio. Esto ayuda a prevenir futuras distensiones  musculares.  Consulte al mdico cundo puede volver a conducir si tiene una frula.  Concurra a todas las visitas de seguimiento como se lo haya indicado el mdico. Esto es importante. Comunquese con un mdico si:  Siente ms dolor o tiene ms hinchazn en el lugar de la lesin. Solicite ayuda de inmediato si:  Si tiene alguno de [employer] en el lugar de la lesin: ? Siente entumecimiento. ? Siente hormigueos. ? Pierde mucha fuerza. Resumen  Una distensin muscular es una lesin que se produce cuando un msculo se estira ms all de su largo normal.  Inicialmente, se trata con terapia PRICE (proteccin, reposo, hielo, compresin, elevacin). Esto implica proteger la lesin, aplicar hielo y presin, levantar el rea y dejarla en reposo.  Limite las actividades. Deje en reposo el msculo lesionado como se lo haya indicado el mdico. El mdico puede decirle que los movimientos suaves no representan un problema.  Entre en calor antes de hacer ejercicio. Esto ayuda a prevenir futuras distensiones musculares. Esta informacin no tiene Marine scientist el consejo del mdico. Asegrese de hacerle al mdico cualquier pregunta que tenga. Document Released: 12/29/2008 Document Revised: 07/02/2017 Document Reviewed: 07/02/2017 Elsevier Interactive Patient Education  2019 [employer].

## 2018-10-15 ENCOUNTER — Ambulatory Visit: Payer: Self-pay

## 2018-10-15 NOTE — Telephone Encounter (Signed)
Summary: abdominal pain    Pt called and stated that she has been having abdominal pain related to the vagina. Pt states that she has been having watery discharge. Pt would like to know what she needs to do. Please advise          Attempted to call patient to discuss her symptoms using [employer]. It was the wrong # according to whom ever answered the phone.

## 2018-10-17 NOTE — Telephone Encounter (Signed)
Please schedule patient to discuss her concerns and symptoms. thanks

## 2018-10-17 NOTE — Telephone Encounter (Signed)
FYI

## 2018-10-19 ENCOUNTER — Telehealth: Payer: Self-pay | Admitting: Family Medicine

## 2018-10-19 NOTE — Telephone Encounter (Signed)
Called pt through [employer]. Per Dr. Pamella Pert, I was able to get her scheduled but not until 12/28/18 at 8:00 AM due to her needing a Saturday appointment. I did verify that the number listed on file for her is correct - [phone]. I advised of time, building number and late policy. Pt acknowledged.

## 2018-10-25 ENCOUNTER — Ambulatory Visit: Payer: BC Managed Care – PPO | Admitting: Family Medicine

## 2018-12-14 ENCOUNTER — Ambulatory Visit: Payer: BC Managed Care – PPO | Admitting: Family Medicine

## 2018-12-28 ENCOUNTER — Encounter: Payer: Self-pay | Admitting: Family Medicine

## 2018-12-28 ENCOUNTER — Other Ambulatory Visit: Payer: Self-pay

## 2018-12-28 ENCOUNTER — Ambulatory Visit: Payer: BC Managed Care – PPO | Admitting: Family Medicine

## 2018-12-28 VITALS — BP 128/82 | HR 67 | Temp 97.5°F | Ht 61.0 in | Wt 222.0 lb

## 2018-12-28 DIAGNOSIS — M7711 Lateral epicondylitis, right elbow: Secondary | ICD-10-CM | POA: Diagnosis not present

## 2018-12-28 MED ORDER — IBUPROFEN 800 MG PO TABS
800.0000 mg | ORAL_TABLET | Freq: Three times a day (TID) | ORAL | 1 refills | Status: DC | PRN
Start: 2018-12-28 — End: 2022-12-28

## 2018-12-28 NOTE — Patient Instructions (Addendum)
If you have lab work done today you will be contacted with your lab results within the next 2 weeks.  If you have not heard from [place] then please contact us. The fastest way to get your results is to register for My Chart.   IF you received an x-ray today, you will receive an invoice from [facility]. Please contact [facility] at [phone] with questions or concerns regarding your invoice.   IF you received labwork today, you will receive an invoice from [facility]. Please contact [facility] at [phone] with questions or concerns regarding your invoice.   Our billing staff will not be able to assist you with questions regarding bills from these companies.  You will be contacted with the lab results as soon as they are available. The fastest way to get your results is to activate your My Chart account. Instructions are located on the last page of this paperwork. If you have not heard from [place] regarding the results in 2 weeks, please contact this office.     Codo de [place] con rehabilitacin Tennis Elbow Rehab Consulte al mdico qu ejercicios son seguros para usted. Haga los ejercicios exactamente como se lo haya indicado el mdico y gradelos como se lo hayan indicado. Es normal sentir tirantez, tensin, presin o molestias leves mientras hace estos ejercicios, pero debe detenerse de inmediato si siente dolor repentino o si el dolor empeora. No comience a hacer estos ejercicios hasta que se lo indique el mdico. EJERCICIOS DE [employer] Y AMPLITUD DE MOVIMIENTOS Estos ejercicios calientan los msculos y las articulaciones, y mejoran la movilidad y la flexibilidad del codo. Estos ejercicios tambin ayudan a Best boy, el adormecimiento y el hormigueo. EjercicioA: Estiramiento del extensor de la mueca 1. Extienda el codo izquierdo/derecho con los dedos apuntando hacia abajo. 2. Tire suavemente la palma de la mano izquierda/derecha hacia usted hasta sentir un  ligero estiramiento en la parte superior del antebrazo. 3. Para intensificar el estiramiento, lleve la mano izquierda/derecha hacia el lado del meique o el borde externo del Management consultant. 4. Mantenga esta posicin durante ___________ segundos. Repita __________ veces. Realice este ejercicio __________ veces al da. Si se lo indic el mdico, repita el estiramiento, pero esta vez con el codo flexionado. EjercicioB: Estiramiento del flexor de la mueca 1. Extienda el codo izquierdo/derecho y gire la palma [place] arriba. 2. Empuje suavemente con la punta de los dedos y la palma izquierda/derecha hacia atrs, de modo que la [place] se extienda y los dedos apunten hacia el suelo. 3. Debe sentir un ligero estiramiento en la parte interior del antebrazo. 4. Mantenga esta posicin durante ___________ segundos. Repita __________ veces. Realice este ejercicio __________ veces al da. Si se lo indic el mdico, repita el estiramiento, pero esta vez con el codo flexionado. EJERCICIOS DE FORTALECIMIENTO Estos ejercicios fortalecen el codo y le otorgan resistencia. La resistencia es la capacidad de usar los msculos durante un tiempo prolongado, incluso despus de que se cansen. EjercicioC: Extensores de Advertising copywriter 1. Sintese con el antebrazo izquierdo/derecho con la palma hacia abajo y completamente apoyado sobre una mesa o encimera. El codo debe estar en reposo y a la altura de los hombros. 2. Deje que la mueca izquierda/derecha se extienda sobre el borde de la superficie. 3. Sostenga sin apretar una pesa de __________ Merla Riches banda de goma para ejercicios en la mano izquierda/derecha. Doble lentamente la mano izquierda/derecha hacia el antebrazo. Si est usando una banda, sostngala en el lugar con la  Liliana Cline, para aumentar la resistencia. 4. Mantenga esta posicin durante ___________ segundos. 5. Vuelva lentamente a la posicin inicial. Repita __________ veces. Realice este ejercicio __________ veces al  da. EjercicioD: Desviaciones radiales 1. Prese con una pesa de __________ en la mano izquierda/derecha. O bien, sintese sosteniendo una banda de goma para ejercicios con el otro brazo apoyado en una mesa o encimera. Ubique la mano de forma que el dedo pulgar [place]. 2. Andrey Cota la mano hacia el frente de forma que el dedo pulgar apunte hacia el antebrazo o tire hacia arriba la banda de goma. 3. Mantenga esta posicin durante ___________ segundos. 4. Vuelva lentamente a la posicin inicial. Repita __________ veces. Realice este ejercicio __________ veces al da. EjercicioE: Extensores de Advertising copywriter, ejercicio excntrico 1. Sintese con el antebrazo izquierdo/derecho con la palma hacia abajo y completamente apoyado sobre una mesa o encimera. El codo debe estar en reposo y a la altura de los hombros. 2. Si el mdico se lo ha indicado, sostenga una pesa de __________ [employer]. 3. Deje que la mueca izquierda/derecha se extienda sobre el borde de la superficie. 4. Use la otra mano para subir la mano izquierda/derecha hacia el [place]. Mantenga el antebrazo en la mesa. 5. Use solo los msculos de la mano izquierda/derecha para bajar lentamente la mano a la posicin inicial. Repita __________ veces. Realice este ejercicio __________ veces al da. Esta informacin no tiene Marine scientist el consejo del mdico. Asegrese de hacerle al mdico cualquier pregunta que tenga. Document Released: 07/19/2006 Document Revised: 02/16/2015 Document Reviewed: 07/01/2015 Elsevier Interactive Patient Education  2019 [place] tenista Tennis Elbow El codo de tenista es la hinchazn (inflamacin) en la zona externa del Joycie Peek, cerca del codo. La hinchazn afecta los tejidos que conectan el msculo al hueso (tendones). Esta afeccin puede presentarse si practica un deporte o realiza una tarea que exige el uso excesivo del codo. La causa del codo de [place] es la repeticin del mismo  movimiento una y Elmon Kirschner. El codo de [place] puede causar lo siguiente:  Dolor y sensibilidad a la palpacin en el Management consultant y la parte externa del codo. Puede sentir dolor todo Physiological scientist o solo cuando utiliza el brazo.  Sensacin de ardor que se extiende desde el codo Loss adjuster, chartered.  Agarre dbil en la mano. Siga estas indicaciones en su casa: Actividad  Descanse el codo y la [place]. Evite las actividades que causen [place], como se lo haya indicado el mdico.  Si se lo indica el mdico, use una correa para codo para reducir la presin sobre la zona.  Haga los ejercicios de fisioterapia como se lo hayan indicado.  Si levanta un objeto, hgalo con la palma de la mano [place]. [employer], se reduce el esfuerzo en el codo. Estilo de vida  Si el codo de [place] se debe a los deportes, revise el equipo y Chief Strategy Officer de lo siguiente: ? Que lo [place] correctamente. ? Que es apto para usted.  Si el codo de [place] se debe a su trabajo o al uso de una computadora, tmese descansos con frecuencia para Publishing rights manager. Hable con su jefe de personal sobre cmo puede controlar su afeccin en el trabajo. Si tiene un dispositivo ortopdico:  selo como se lo haya indicado el mdico. Qutesela solamente como se lo haya indicado el mdico.  Afloje el dispositivo ortopdico si los dedos se le adormecen, siente hormigueos o se le enfran y se  tornan de color azul.  Mantenga el dispositivo ortopdico limpio.  Si el dispositivo ortopdico no es impermeable, consulte a su mdico si puede quitarse el dispositivo para baarse. Si debe tener puesto el dispositivo ortopdico mientras se baa: ? No deje que se mojen. ? Cbralos con un envoltorio hermtico cuando tome un bao de inmersin o una ducha. Instrucciones generales   Si se lo indican, aplique hielo sobre la zona dolorida: ? Field seismologist hielo en una bolsa plstica. ? Coloque una [employer] piel y la bolsa de hielo. ? Coloque el hielo  durante 74minutos, de 2 a 3veces por da.  Tome los medicamentos de venta libre y los recetados solamente como se lo haya indicado el mdico.  Consulting civil engineer a todas las visitas de seguimiento como se lo haya indicado el mdico. Esto es importante. Comunquese con un mdico si:  El dolor no mejora con Dispensing optician.  El dolor [place].  Tiene debilidad en el antebrazo, la mano o los dedos de la [place].  No puede sentir el antebrazo, la mano o los dedos de la [place]. Resumen  El codo de tenista es la hinchazn (inflamacin) en la zona externa del Joycie Peek, cerca del codo.  Su causa es la repeticin del mismo movimiento una y Elmon Kirschner.  Descanse el codo y la [place]. Evite las actividades que causen [place], como se lo haya indicado el mdico.  Si se lo indican, aplique hielo sobre la zona dolorida durante 20 minutos, de 2 a Occupational psychologist. Esta informacin no tiene Marine scientist el consejo del mdico. Asegrese de hacerle al mdico cualquier pregunta que tenga. Document Released: 11/04/2010 Document Revised: 10/17/2017 Document Reviewed: 10/17/2017 Elsevier Interactive Patient Education  2019 [employer].

## 2018-12-28 NOTE — Progress Notes (Signed)
   3/14/202010:31 AM  Isabel Deleon 1966/06/28, 53 y.o. female [id number]  Chief Complaint  Patient presents with  . Arm Pain    pain in the right arm, reoccouring issue. Was told if the pain did not go away to return. Seen in Dec for this issue    HPI:   Patient is a 53 y.o. female who presents today for right arm pain  Pain along extensor side of right forearm, feels it starts proximally and travels distally, worse when she tries to grab something Seen in Dec 2019 - dx with muscle strain of right upper arm  Fall Risk  10/05/2018 08/10/2018 03/29/2018 03/29/2018 02/01/2018  Falls in the past year? 0 No No No No     Depression screen [facility] 2/9 10/05/2018 08/10/2018 03/29/2018  Decreased Interest 0 0 0  Down, Depressed, Hopeless 0 0 0  PHQ - 2 Score 0 0 0    No Known Allergies  Prior to Admission medications   Medication Sig Start Date End Date Taking? Authorizing Provider  diclofenac sodium (VOLTAREN) 1 % GEL Apply 4 g topically 4 (four) times daily. 10/05/18  Yes Scot Jun, FNP  ibuprofen (ADVIL,MOTRIN) 800 MG tablet Take 1 tablet (800 mg total) by mouth every 8 (eight) hours as needed. 10/10/17  Yes Salvadore Dom, MD  meloxicam (MOBIC) 15 MG tablet Take 1 tablet (15 mg total) by mouth daily as needed for pain. 10/05/18  Yes Scot Jun, FNP  Multiple Vitamin (MULTIVITAMIN) tablet Take 1 tablet by mouth daily.   Yes [provider]    Past Medical History:  Diagnosis Date  . Hyperlipidemia   . UTI (lower urinary tract infection)     Past Surgical History:  Procedure Laterality Date  . TUBAL LIGATION      Social History   Tobacco Use  . Smoking status: Never Smoker  . Smokeless tobacco: Never Used  Substance Use Topics  . Alcohol use: No    Family History  Problem Relation Age of Onset  . Hypertension Mother     ROS Per hpi  OBJECTIVE:  Blood pressure 128/82, pulse 67, temperature (!) 97.5 F (36.4 C), temperature  source Oral, height 5\' 1"  (1.549 m), weight 222 lb (100.7 kg), SpO2 96 %. Body mass index is 41.95 kg/m.    Physical Exam  Gen: AAOx3, NAD RUE: shoulder, elbow, wrist FROM, TTP at lateral epicondyle. NVI.   ASSESSMENT and PLAN  1. Lateral epicondylitis of right elbow Discussed supportive measures, new meds r/se/b and RTC precautions. Patient educational handout given. - Apply other splint  Other orders - ibuprofen (ADVIL,MOTRIN) 800 MG tablet; Take 1 tablet (800 mg total) by mouth every 8 (eight) hours as needed.  Return if symptoms worsen or fail to improve.    Rutherford Guys, MD [facility] at [facility] [place], [place] [address] Ph.  [phone] Fax [phone]

## 2019-03-26 ENCOUNTER — Telehealth: Payer: Self-pay | Admitting: Family Medicine

## 2019-03-26 NOTE — Telephone Encounter (Signed)
Please schedule patient to discuss her weight concerns. thanks

## 2019-03-26 NOTE — Telephone Encounter (Signed)
Last seen 12/28/18 for Lateral epicondylitis of right elbow   Please advise if you are willing to send referral or do you want to see pt first?

## 2019-03-26 NOTE — Telephone Encounter (Signed)
Copied from [place] [id number]. Topic: Referral - Request for Referral >> Mar 26, 2019 10:40 AM Marin Olp L wrote: Has patient seen PCP for this complaint? Yes per patient *If NO, is insurance requiring patient see PCP for this issue before PCP can refer them? Referral for which specialty: nutritionist Preferred provider/office: Dr. Pamella Pert recommendation Reason for referral: weight management  Please call once referral is placed. Needs spanish interpreter.

## 2019-03-27 ENCOUNTER — Telehealth: Payer: Self-pay | Admitting: Family Medicine

## 2019-03-27 NOTE — Telephone Encounter (Signed)
Pt called [place] back and does not want to schedule appt for weight loss with Dr.Santiago at this time

## 2019-03-27 NOTE — Telephone Encounter (Signed)
LVM for patient to call back and schedule an appointment with Dr. Pamella Pert

## 2019-04-01 ENCOUNTER — Ambulatory Visit: Payer: Self-pay

## 2019-04-01 NOTE — Telephone Encounter (Signed)
Pt. Called to report symptoms of pain with urination and foul smell to her urine, x approx. 1 mo.  Called an Interpreter to assist with communication.  Pt. also c/o itching of the abdomen.  Denied fever or chills.  Denied blood in urine.  Stated she has had some pain in left back.  Requested appt. With PCP.  PC to Flow Coordinator.  Transferred call to Scheduling for an appt.  Scheduled for an appt. 6/17, with PCP.  Pt. Agreed with plan.   Reason for Disposition . Bad or foul-smelling urine  Answer Assessment - Initial Assessment Questions 1. SYMPTOM: "What's the main symptom you're concerned about?" (e.g., frequency, incontinence)     Difficulty urinating;; "not urinating that much"  2. ONSET: "When did the sx's start?"     About a month ago 3. PAIN: "Is there any pain?" If so, ask: "How bad is it?" (Scale: 1-10; mild, moderate, severe)    Hurts to urinate  4. CAUSE: "What do you think is causing the symptoms?"    Unknown 5. OTHER SYMPTOMS: "Do you have any other symptoms?" (e.g., fever, flank pain, blood in urine, pain with urination)     Itching in abdomen; no fever/ chills; bad odor to urine; pain in left back  6. PREGNANCY: "Is there any chance you are pregnant?" "When was your last menstrual period?"     No menstrual cycle x 8 mos. ; thinks she has started menopause  Protocols used: URINARY [facility]

## 2019-04-02 ENCOUNTER — Ambulatory Visit: Payer: BC Managed Care – PPO | Admitting: Family Medicine

## 2019-04-02 ENCOUNTER — Other Ambulatory Visit: Payer: Self-pay

## 2019-04-02 ENCOUNTER — Encounter: Payer: Self-pay | Admitting: Family Medicine

## 2019-04-02 VITALS — BP 132/80 | HR 98 | Temp 99.0°F | Resp 18 | Ht 60.87 in | Wt 233.4 lb

## 2019-04-02 DIAGNOSIS — R339 Retention of urine, unspecified: Secondary | ICD-10-CM | POA: Diagnosis not present

## 2019-04-02 LAB — POCT URINALYSIS DIP (MANUAL ENTRY)
Bilirubin, UA: NEGATIVE
Blood, UA: NEGATIVE
Glucose, UA: NEGATIVE mg/dL
Ketones, POC UA: NEGATIVE mg/dL
Leukocytes, UA: NEGATIVE
Nitrite, UA: NEGATIVE
Protein Ur, POC: NEGATIVE mg/dL
Spec Grav, UA: 1.025 (ref 1.010–1.025)
Urobilinogen, UA: 0.2 E.U./dL
pH, UA: 7 (ref 5.0–8.0)

## 2019-04-02 NOTE — Progress Notes (Signed)
Acute Office Visit  Subjective:    Patient ID: Isabel Deleon, female    DOB: 21-Nov-1965, 53 y.o.   MRN: [id number]  Chief Complaint  Patient presents with  . Urinary Retention    X 3 weeks  . Abdominal Pain    X 3 weeks with back pain  . Dysuria    X 3 weeks    HPI Patient is in today for abdominal pain-lower, painful urination and urinary concerns for 3 weeks.  Pt states painful intercourse.  Normal pap smear in 10/19. Not vaginal discharge. Pt G5-vaginal deliveries. Pt states tubal ligation otherwise no abdominal surgeries.  Pt reports no loss of urine. No blood in the urine. No fever  Past Medical History:  Diagnosis Date  . Hyperlipidemia   . UTI (lower urinary tract infection)     Past Surgical History:  Procedure Laterality Date  . TUBAL LIGATION      Family History  Problem Relation Age of Onset  . Hypertension Mother     Social History   Socioeconomic History  . Marital status: Married    Spouse name: Not on file  . Number of children: 5  . Years of education: Not on file  . Highest education level: Not on file  Occupational History  . Not on file  Social Needs  . Financial resource strain: Not on file  . Food insecurity    Worry: Not on file    Inability: Not on file  . Transportation needs    Medical: Not on file    Non-medical: Not on file  Tobacco Use  . Smoking status: Never Smoker  . Smokeless tobacco: Never Used  Substance and Sexual Activity  . Alcohol use: No  . Drug use: No  . Sexual activity: Yes    Partners: Male    Birth control/protection: Surgical  Lifestyle  . Physical activity    Days per week: Not on file    Minutes per session: Not on file  . Stress: Not on file  Relationships  . Social Herbalist on phone: Not on file    Gets together: Not on file    Attends religious service: Not on file    Active member of club or organization: Not on file    Attends meetings of clubs or organizations: Not on file     Relationship status: Not on file  . Intimate partner violence    Fear of current or ex partner: Not on file    Emotionally abused: Not on file    Physically abused: Not on file    Forced sexual activity: Not on file  Other Topics Concern  . Not on file  Social History Narrative  . Not on file    Outpatient Medications Prior to Visit  Medication Sig Dispense Refill  . Multiple Vitamin (MULTIVITAMIN) tablet Take 1 tablet by mouth daily.    Marland Kitchen ibuprofen (ADVIL,MOTRIN) 800 MG tablet Take 1 tablet (800 mg total) by mouth every 8 (eight) hours as needed. (Patient not taking: Reported on 04/02/2019) 30 tablet 1   No facility-administered medications prior to visit.     No Known Allergies  Review of Systems  Constitutional: Negative for chills, fever, malaise/fatigue and weight loss.  Gastrointestinal: Positive for constipation. Negative for blood in stool, diarrhea, heartburn, nausea and vomiting.  Genitourinary: Positive for dysuria, frequency and urgency. Negative for flank pain and hematuria.  Musculoskeletal: Negative for back pain.  Objective:    Physical Exam  Constitutional: She appears well-developed and well-nourished.  Cardiovascular: Normal rate and regular rhythm.  Pulmonary/Chest: [place] normal and breath sounds normal.  Abdominal: Soft. There is abdominal tenderness. There is no rebound and no guarding.  Genitourinary:    Vagina and uterus normal.     No vaginal discharge.   unable to palpate ovaries No cervical motion tenderness suprapublic tenderness to palpation No external lesions noted  BP 132/80 (BP Location: Right Arm, Patient Position: Sitting, Cuff Size: Large)   Pulse 98   Temp 99 F (37.2 C) (Oral)   Resp 18   Ht 5' 0.87" (1.546 m)   Wt 233 lb 6.4 oz (105.9 kg)   SpO2 97%   BMI 44.29 kg/m  Wt Readings from Last 3 Encounters:  04/02/19 233 lb 6.4 oz (105.9 kg)  12/28/18 222 lb (100.7 kg)  10/05/18 221 lb 6.4 oz (100.4 kg)    Health  Maintenance Due  Topic Date Due  . HIV Screening  07/25/1980  . TETANUS/TDAP  07/25/1984  . MAMMOGRAM  07/26/2015  . COLONOSCOPY  07/26/2015    There are no preventive care reminders to display for this patient.   Lab Results  Component Value Date   TSH 2.760 05/17/2017   Lab Results  Component Value Date   WBC 8.4 05/17/2017   HGB 13.7 05/17/2017   HCT 42.1 05/17/2017   MCV 89 05/17/2017   PLT 317 05/17/2017   Lab Results  Component Value Date   NA 142 05/17/2017   K 4.2 05/17/2017   CO2 25 05/17/2017   GLUCOSE 98 05/17/2017   BUN 10 05/17/2017   CREATININE 0.68 05/17/2017   BILITOT <0.2 05/17/2017   ALKPHOS 58 05/17/2017   AST 25 05/17/2017   ALT 30 05/17/2017   PROT 7.1 05/17/2017   ALBUMIN 4.4 05/17/2017   CALCIUM 9.3 05/17/2017   Lab Results  Component Value Date   CHOL 208 (H) 12/28/2009   Lab Results  Component Value Date   HDL 58 12/28/2009   Lab Results  Component Value Date   LDLCALC 128 (H) 12/28/2009   Lab Results  Component Value Date   TRIG 112 12/28/2009   Lab Results  Component Value Date   CHOLHDL 3.6 Ratio 12/28/2009   Lab Results  Component Value Date   HGBA1C 5.7 (H) 05/17/2017       Assessment & Plan:   Problem List Items Addressed This Visit    None    Visit Diagnoses    Urinary retention    -  Primary   Relevant Orders   POCT urinalysis dipstick (Completed)     U/a normal Ultrasound abdomen/pelvis Will consider    LISA Hannah Beat, MD

## 2019-04-02 NOTE — Patient Instructions (Addendum)
Ultrasound abdominal /pelvis-we will call tomorrow with an appt

## 2019-04-03 ENCOUNTER — Encounter: Payer: Self-pay | Admitting: Family Medicine

## 2019-04-03 NOTE — Telephone Encounter (Signed)
Please make abdominal ultrasound appt as soon as possible. With with ongoing symptoms for 3 weeks Make appt after 2pm  if possible-pt works until 1:30pm

## 2019-04-04 NOTE — Telephone Encounter (Signed)
Please see message below and advise on the order change

## 2019-04-05 ENCOUNTER — Other Ambulatory Visit: Payer: Self-pay | Admitting: Family Medicine

## 2019-04-05 DIAGNOSIS — R339 Retention of urine, unspecified: Secondary | ICD-10-CM

## 2019-04-05 NOTE — Telephone Encounter (Signed)
Changed abdominal to pelvic u/s as requested for evaluation

## 2019-05-22 ENCOUNTER — Other Ambulatory Visit: Payer: Self-pay

## 2019-05-22 ENCOUNTER — Encounter: Payer: Self-pay | Admitting: Emergency Medicine

## 2019-05-22 ENCOUNTER — Ambulatory Visit: Payer: BC Managed Care – PPO | Admitting: Emergency Medicine

## 2019-05-22 VITALS — BP 134/80 | HR 84 | Temp 98.7°F | Ht 60.0 in | Wt 237.6 lb

## 2019-05-22 DIAGNOSIS — Z23 Encounter for immunization: Secondary | ICD-10-CM

## 2019-05-22 DIAGNOSIS — M791 Myalgia, unspecified site: Secondary | ICD-10-CM

## 2019-05-22 DIAGNOSIS — R600 Localized edema: Secondary | ICD-10-CM

## 2019-05-22 NOTE — Patient Instructions (Addendum)
   If you have lab work done today you will be contacted with your lab results within the next 2 weeks.  If you have not heard from us then please contact us. The fastest way to get your results is to register for My Chart.   IF you received an x-ray today, you will receive an invoice from [facility]. Please contact [facility] at [phone] with questions or concerns regarding your invoice.   IF you received labwork today, you will receive an invoice from [facility]. Please contact [facility] at [phone] with questions or concerns regarding your invoice.   Our billing staff will not be able to assist you with questions regarding bills from these companies.  You will be contacted with the lab results as soon as they are available. The fastest way to get your results is to activate your My Chart account. Instructions are located on the last page of this paperwork. If you have not heard from us regarding the results in 2 weeks, please contact this office.     Mantenimiento de la salud en las mujeres Health Maintenance, Female Adoptar un estilo de vida saludable y recibir atencin preventiva son importantes para promover la salud y el bienestar. Consulte al mdico sobre:  El esquema adecuado para hacerse pruebas y exmenes peridicos.  Cosas que puede hacer por su cuenta para prevenir enfermedades y mantenerse sana. Qu debo saber sobre la dieta, el peso y el ejercicio? Consuma una dieta saludable   Consuma una dieta que incluya muchas verduras, frutas, productos lcteos con bajo contenido de grasa y protenas magras.  No consuma muchos alimentos ricos en grasas slidas, azcares agregados o sodio. Mantenga un peso saludable El ndice de masa muscular (IMC) se utiliza para identificar problemas de peso. Proporciona una estimacin de la grasa corporal basndose en el peso y la altura. Su mdico puede ayudarle a determinar su IMC y a lograr o mantener un peso  saludable. Haga ejercicio con regularidad Haga ejercicio con regularidad. Esta es una de las prcticas ms importantes que puede hacer por su salud. La mayora de los adultos deben seguir estas pautas:  Realizar, al menos, 150minutos de actividad fsica por semana. El ejercicio debe aumentar la frecuencia cardaca y hacerlo transpirar (ejercicio de intensidad moderada).  Hacer ejercicios de fortalecimiento por lo menos dos veces por semana. Agregue esto a su plan de ejercicio de intensidad moderada.  Pasar menos tiempo sentados. Incluso la actividad fsica ligera puede ser beneficiosa. Controle sus niveles de colesterol y lpidos en la sangre Comience a realizarse anlisis de lpidos y colesterol en la sangre a los 20aos y luego reptalos cada 5aos. Hgase controlar los niveles de colesterol con mayor frecuencia si:  Sus niveles de lpidos y colesterol son altos.  Es mayor de 40aos.  Presenta un alto riesgo de padecer enfermedades cardacas. Qu debo saber sobre las pruebas de deteccin del cncer? Segn su historia clnica y sus antecedentes familiares, es posible que deba realizarse pruebas de deteccin del cncer en diferentes edades. Esto puede incluir pruebas de deteccin de lo siguiente:  Cncer de mama.  Cncer de cuello uterino.  Cncer colorrectal.  Cncer de piel.  Cncer de pulmn. Qu debo saber sobre la enfermedad cardaca, la diabetes y la hipertensin arterial? Presin arterial y enfermedad cardaca  La hipertensin arterial causa enfermedades cardacas y aumenta el riesgo de accidente cerebrovascular. Es ms probable que esto se manifieste en las personas que tienen lecturas de presin arterial alta, tienen ascendencia africana o   tienen sobrepeso.  Hgase controlar la presin arterial: ? Cada 3 a 5 aos si tiene entre 18 y 39 aos. ? Todos los aos si es mayor de 40aos. Diabetes Realcese exmenes de deteccin de la diabetes con regularidad. Este  anlisis revisa el nivel de azcar en la sangre en ayunas. Hgase las pruebas de deteccin:  Cada tresaos despus de los 40aos de edad si tiene un peso normal y un bajo riesgo de padecer diabetes.  Con ms frecuencia y a partir de una edad inferior si tiene sobrepeso o un alto riesgo de padecer diabetes. Qu debo saber sobre la prevencin de infecciones? Hepatitis B Si tiene un riesgo ms alto de contraer hepatitis B, debe someterse a un examen de deteccin de este virus. Hable con el mdico para averiguar si tiene riesgo de contraer la infeccin por hepatitis B. Hepatitis C Se recomienda el anlisis a:  Todos los que nacieron entre 1945 y 1965.  Todas las personas que tengan un riesgo de haber contrado hepatitis C. Enfermedades de transmisin sexual (ETS)  Hgase las pruebas de deteccin de ITS, incluidas la gonorrea y la clamidia, si: ? Es sexualmente activa y es menor de 24aos. ? Es mayor de 24aos, y el mdico le informa que corre riesgo de tener este tipo de infecciones. ? La actividad sexual ha cambiado desde que le hicieron la ltima prueba de deteccin y tiene un riesgo mayor de tener clamidia o gonorrea. Pregntele al mdico si usted tiene riesgo.  Pregntele al mdico si usted tiene un alto riesgo de contraer VIH. El mdico tambin puede recomendarle un medicamento recetado para ayudar a evitar la infeccin por el VIH. Si elige tomar medicamentos para prevenir el VIH, primero debe hacerse los anlisis de deteccin del VIH. Luego debe hacerse anlisis cada 3meses mientras est tomando los medicamentos. Embarazo  Si est por dejar de menstruar (fase premenopusica) y usted puede quedar embarazada, busque asesoramiento antes de quedar embarazada.  Tome de 400 a 800microgramos (mcg) de cido flico todos los das si queda embarazada.  Pida mtodos de control de la natalidad (anticonceptivos) si desea evitar un embarazo no deseado. Osteoporosis y menopausia La  osteoporosis es una enfermedad en la que los huesos pierden los minerales y la fuerza por el avance de la edad. El resultado pueden ser fracturas en los huesos. Si tiene 65aos o ms, o si est en riesgo de sufrir osteoporosis y fracturas, pregunte a su mdico si debe:  Hacerse pruebas de deteccin de prdida sea.  Tomar un suplemento de calcio o de vitamina D para reducir el riesgo de fracturas.  Recibir terapia de reemplazo hormonal (TRH) para tratar los sntomas de la menopausia. Siga estas instrucciones en su casa: Estilo de vida  No consuma ningn producto que contenga nicotina o tabaco, como cigarrillos, cigarrillos electrnicos y tabaco de mascar. Si necesita ayuda para dejar de fumar, consulte al mdico.  No consuma drogas.  No comparta agujas.  Solicite ayuda a su mdico si necesita apoyo o informacin para abandonar las drogas. Consumo de alcohol  No beba alcohol si: ? Su mdico le indica no hacerlo. ? Est embarazada, puede estar embarazada o est tratando de quedar embarazada.  Si bebe alcohol: ? Limite la cantidad que consume de 0 a 1 medida por da. ? Limite la ingesta si est amamantando.  Est atento a la cantidad de alcohol que hay en las bebidas que toma. En los [place], una medida equivale a una botella de cerveza de 12oz (355ml),   un vaso de vino de 5oz (128ml) o un vaso de una bebida alcohlica de alta graduacin de 1oz (96ml). Instrucciones generales  Realcese los estudios de rutina de la salud, dentales y de Public librarian.  [place].  Infrmele a su mdico si: ? Se siente deprimida con frecuencia. ? Alguna vez ha sido vctima de [place] o no se siente segura en su casa. Resumen  Adoptar un estilo de vida saludable y recibir atencin preventiva son importantes para promover la salud y Musician.  Siga las instrucciones del mdico acerca de una dieta saludable, el ejercicio y la realizacin de pruebas o exmenes  para Engineer, building services.  Siga las instrucciones del mdico con respecto al control del colesterol y la presin arterial. Esta informacin no tiene Marine scientist el consejo del mdico. Asegrese de hacerle al mdico cualquier pregunta que tenga. Document Released: 09/21/2011 Document Revised: 10/23/2018 Document Reviewed: 10/23/2018 Elsevier Patient Education  2020 [employer].

## 2019-05-22 NOTE — Progress Notes (Signed)
Isabel Deleon 53 y.o.   Chief Complaint  Patient presents with  . Leg Pain    both x 5 months and ESTABLISH CARE - change from Dr Pamella Pert  . Arm Pain    both to shoulders x 5 months    HISTORY OF PRESENT ILLNESS: This is a 53 y.o. female complaining of 24-month history of extremity muscle pains.  Denies injury.  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  Denies any lifestyle changes.  Low stress life.  Denies depression or anxiety.  No significant past medical history.  No chronic medications.  Denies rash or joint pains.  No other associated symptomatology.  Postmenopausal.  Saw her GYN doctor last year and everything was okay.  HPI   Prior to Admission medications   Medication Sig Start Date End Date Taking? Authorizing Provider  ibuprofen (ADVIL,MOTRIN) 800 MG tablet Take 1 tablet (800 mg total) by mouth every 8 (eight) hours as needed. 12/28/18  Yes Rutherford Guys, MD  Multiple Vitamin (MULTIVITAMIN) tablet Take 1 tablet by mouth daily.   Yes [provider]    No Known Allergies  Patient Active Problem List   Diagnosis Date Noted  . Pre-diabetes 08/11/2017  . Chronic pain of toes of both feet 01/17/2017  . 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 Hyperlipidemia   . UTI (lower urinary tract infection)     Past Surgical History:  Procedure Laterality Date  . TUBAL LIGATION      Social History   Socioeconomic History  . Marital status: Married    Spouse name: Not on file  . Number of children: 5  . Years of education: Not on file  . Highest education level: Not on file  Occupational History  . Not on file  Social Needs  . Financial resource strain: Not on file  . Food insecurity    Worry: Not on file    Inability: Not on file  . Transportation needs    Medical: Not on file    Non-medical: Not on file  Tobacco Use  . Smoking status: Never Smoker  . Smokeless tobacco: Never Used   Substance and Sexual Activity  . Alcohol use: No  . Drug use: No  . Sexual activity: Yes    Partners: Male    Birth control/protection: Surgical  Lifestyle  . Physical activity    Days per week: Not on file    Minutes per session: Not on file  . Stress: Not on file  Relationships  . Social Herbalist on phone: Not on file    Gets together: Not on file    Attends religious service: Not on file    Active member of club or organization: Not on file    Attends meetings of clubs or organizations: Not on file    Relationship status: Not on file  . Intimate partner violence    Fear of current or ex partner: Not on file    Emotionally abused: Not on file    Physically abused: Not on file    Forced sexual activity: Not on file  Other Topics Concern  . Not on file  Social History Narrative  . Not on file    Family History  Problem Relation Age of Onset  . Hypertension Mother      Review of Systems  Constitutional: Negative.  Negative for chills and fever.  HENT:  Negative.  Negative for congestion and sore throat.   Respiratory: Negative.  Negative for cough and shortness of breath.   Cardiovascular: Negative.  Negative for chest pain and palpitations.  Gastrointestinal: Negative.  Negative for abdominal pain, nausea and vomiting.  Genitourinary: Negative.  Negative for dysuria and hematuria.  Musculoskeletal: Positive for myalgias. Negative for back pain, joint pain and neck pain.  Skin: Negative.  Negative for rash.  Neurological: Negative.  Negative for dizziness, tingling, sensory change, focal weakness, seizures and headaches.  Psychiatric/Behavioral: Negative for depression. The patient is not nervous/anxious and does not have insomnia.    Today's Vitals   05/22/19 1538  BP: 134/80  Pulse: 84  Temp: 98.7 F (37.1 C)  TempSrc: Oral  Weight: 237 lb 9.6 oz (107.8 kg)  Height: 5' (1.524 m)   Body mass index is 46.4 kg/m.   Physical Exam Constitutional:       Appearance: She is obese.  HENT:     Head: Normocephalic and atraumatic.     Mouth/Throat:     Mouth: Mucous membranes are moist.     Pharynx: Oropharynx is clear.  Eyes:     Extraocular Movements: Extraocular movements intact.     Conjunctiva/sclera: Conjunctivae normal.     Pupils: Pupils are equal, round, and reactive to light.  Neck:     Musculoskeletal: Normal range of motion.  Cardiovascular:     Rate and Rhythm: Normal rate and regular rhythm.     Heart sounds: Normal heart sounds.  Pulmonary:     Effort: Pulmonary effort is normal.     Breath sounds: Normal breath sounds.  Abdominal:     Palpations: Abdomen is soft.     Tenderness: There is no abdominal tenderness.  Musculoskeletal:        General: Tenderness (Arm and leg muscles) present.     Right lower leg: Edema present.     Left lower leg: Edema present.  Skin:    General: Skin is warm and dry.     Capillary Refill: Capillary refill takes less than 2 seconds.  Neurological:     General: No focal deficit present.     Mental Status: She is alert and oriented to person, place, and time.  Psychiatric:        Mood and Affect: Mood normal.        Behavior: Behavior normal.     A total of 25 minutes was spent in the room with the patient, greater than 50% of which was in counseling/coordination of care regarding differential diagnosis of myalgias, treatment of pain/pain management, diet and nutrition in particular calorie restriction and sugar/sodium intake, need for diagnostic work-up, prognosis, and need for follow-up.   ASSESSMENT & PLAN: Isabel Deleon was seen today for leg pain and arm pain.  Diagnoses and all orders for this visit:  Myalgia -     Comprehensive metabolic panel -     CBC with Differential/Platelet -     ANA,IFA RA Diag Pnl w/rflx Tit/Patn -     Sedimentation Rate  Bilateral lower extremity edema -     Comprehensive metabolic panel -     CBC with Differential/Platelet -     ANA,IFA RA  Diag Pnl w/rflx Tit/Patn -     Sedimentation Rate    Patient Instructions       If you have lab work done today you will be contacted with your lab results within the next 2 weeks.  If you have not heard from  [place] then please contact us. The fastest way to get your results is to register for My Chart.   IF you received an x-ray today, you will receive an invoice from [facility]. Please contact [facility] at [phone] with questions or concerns regarding your invoice.   IF you received labwork today, you will receive an invoice from [facility]. Please contact [facility] at [phone] with questions or concerns regarding your invoice.   Our billing staff will not be able to assist you with questions regarding bills from these companies.  You will be contacted with the lab results as soon as they are available. The fastest way to get your results is to activate your My Chart account. Instructions are located on the last page of this paperwork. If you have not heard from [place] regarding the results in 2 weeks, please contact this office.     Mantenimiento de Technical sales engineer en [place] Maintenance, Female Adoptar un estilo de vida saludable y recibir atencin preventiva son importantes para promover la salud y Musician. Consulte al mdico sobre:  El esquema adecuado para hacerse pruebas y exmenes peridicos.  Cosas que puede hacer por su cuenta para prevenir enfermedades y [employer]. Qu debo saber sobre la dieta, el peso y el ejercicio? Consuma una dieta saludable   Consuma una dieta que incluya muchas verduras, frutas, productos lcteos con bajo contenido de [place] y Advertising account planner.  No consuma muchos alimentos ricos en grasas slidas, azcares agregados o sodio. Mantenga un peso saludable El ndice de masa muscular [facility]) se [place] para identificar problemas de [place]. Proporciona una estimacin de la grasa corporal basndose en el peso y la altura.  Su mdico puede ayudarle a Radiation protection practitioner [place] y a Scientist, forensic o Theatre manager un peso saludable. Haga ejercicio con regularidad Haga ejercicio con regularidad. Esta es una de las prcticas ms importantes que puede hacer por su salud. La mayora de los adultos deben seguir estas pautas:  Optometrist, al menos, 17minutos de actividad fsica por semana. El ejercicio debe aumentar la frecuencia cardaca y Nature conservation officer transpirar (ejercicio de intensidad moderada).  Hacer ejercicios de fortalecimiento por lo [employer] por semana. Agregue esto a su plan de ejercicio de intensidad moderada.  Pasar menos tiempo sentados. Incluso la actividad fsica ligera puede ser beneficiosa. Controle sus niveles de colesterol y lpidos en la sangre Comience a realizarse anlisis de lpidos y Research officer, trade union en la sangre a los 20aos y luego reptalos cada 5aos. Hgase controlar los niveles de colesterol con mayor frecuencia si:  Sus niveles de lpidos y colesterol son altos.  Es mayor de 40aos.  Presenta un alto riesgo de padecer enfermedades cardacas. Qu debo saber sobre las pruebas de deteccin del cncer? Segn su historia clnica y sus antecedentes familiares, es posible que deba realizarse pruebas de deteccin del cncer en diferentes edades. Esto puede incluir pruebas de deteccin de lo siguiente:  Cncer de mama.  Cncer de cuello uterino.  Cncer colorrectal.  Cncer de piel.  Cncer de pulmn. Qu debo saber sobre la enfermedad cardaca, la diabetes y la hipertensin arterial? Presin arterial y enfermedad cardaca  La hipertensin arterial causa enfermedades cardacas y [place] el riesgo de accidente cerebrovascular. Es ms probable que esto se manifieste en las personas que tienen lecturas de presin arterial alta, tienen ascendencia africana o tienen sobrepeso.  Hgase controlar la presin arterial: ? Cada 3 a 5 aos si tiene entre 18 y 51 aos. ? Todos los aos si es mayor de [place].  Diabetes  Realcese exmenes de deteccin de la diabetes con regularidad. Este anlisis revisa el nivel de azcar en la sangre en [place]. Hgase las pruebas de deteccin:  Cada tresaos despus de los 27aos de edad si tiene un peso normal y un bajo riesgo de padecer diabetes.  Con ms frecuencia y a partir de [place] edad inferior si tiene sobrepeso o un alto riesgo de padecer diabetes. Qu debo saber sobre la prevencin de infecciones? Hepatitis B Si tiene un riesgo ms alto de contraer hepatitis B, debe someterse a un examen de deteccin de este virus. Hable con el mdico para averiguar si tiene riesgo de contraer la infeccin por hepatitis B. Hepatitis C Se recomienda el anlisis a:  [employer] 1945 y 1965.  Todas las personas que tengan un riesgo de haber contrado hepatitis C. Enfermedades de transmisin sexual (ETS)  Hgase las pruebas de Programme researcher, broadcasting/film/video de ITS, incluidas la gonorrea y la clamidia, si: ? Es sexualmente activa y es menor de [place]. ? Es mayor de 24aos, y Investment banker, operational informa que corre riesgo de tener este tipo de infecciones. ? La actividad sexual ha cambiado desde que le hicieron la ltima prueba de deteccin y tiene un riesgo mayor de Best boy clamidia o Radio broadcast assistant. Pregntele al mdico si usted tiene riesgo.  Pregntele al mdico si usted tiene un alto riesgo de Museum/gallery curator VIH. El mdico tambin puede recomendarle un medicamento recetado para ayudar a evitar la infeccin por el VIH. Si elige tomar medicamentos para prevenir el VIH, primero debe [employer] de deteccin del VIH. Luego debe hacerse anlisis cada 67meses mientras est tomando los medicamentos. Embarazo  Si est por dejar de Librarian, academic (fase premenopusica) y usted puede quedar [place], busque asesoramiento antes de [place].  Tome de 400 a [id number] (mcg) de cido [employer] si [place].  Pida mtodos de control de la natalidad (anticonceptivos) si desea  evitar un embarazo no deseado. Osteoporosis y [place] La osteoporosis es una enfermedad en la que los huesos pierden los minerales y la fuerza por el avance de la edad. El resultado pueden ser fracturas en los [place]. Si tiene 65aos o ms, o si est en riesgo de sufrir osteoporosis y fracturas, pregunte a su mdico si debe:  Hacerse pruebas de deteccin de prdida sea.  Tomar un suplemento de calcio o de vitamina D para reducir el riesgo de fracturas.  Recibir terapia de reemplazo hormonal (TRH) para tratar los sntomas de la menopausia. Siga estas instrucciones en su casa: Estilo de vida  No consuma ningn producto que contenga nicotina o tabaco, como cigarrillos, cigarrillos electrnicos y tabaco de Higher education careers adviser. Si necesita ayuda para dejar de fumar, consulte al mdico.  No consuma drogas.  No comparta agujas.  Solicite ayuda a su mdico si necesita apoyo o informacin para abandonar las drogas. Consumo de alcohol  No beba alcohol si: ? Su mdico le indica no hacerlo. ? Est embarazada, puede estar embarazada o est tratando de quedar embarazada.  Si bebe alcohol: ? Limite la cantidad que consume de 0 a 1 medida por da. ? Limite la ingesta si est amamantando.  Est atento a la cantidad de alcohol que hay en las bebidas que toma. En los [place], una medida equivale a una botella de cerveza de 12oz (358ml), un vaso de vino de 5oz (152ml) o un vaso de una bebida alcohlica de alta graduacin de 1oz (84ml). Instrucciones generales  Realcese los estudios de rutina de la  salud, dentales y de la vista.  [place].  Infrmele a su mdico si: ? Se siente deprimida con frecuencia. ? Alguna vez ha sido vctima de [place] o no se siente segura en su casa. Resumen  Adoptar un estilo de vida saludable y recibir atencin preventiva son importantes para promover la salud y Musician.  Siga las instrucciones del mdico acerca de una dieta  saludable, el ejercicio y la realizacin de pruebas o exmenes para Engineer, building services.  Siga las instrucciones del mdico con respecto al control del colesterol y la presin arterial. Esta informacin no tiene Marine scientist el consejo del mdico. Asegrese de hacerle al mdico cualquier pregunta que tenga. Document Released: 09/21/2011 Document Revised: 10/23/2018 Document Reviewed: 10/23/2018 Elsevier Patient Education  2020 Elsevier Inc.      Agustina Caroli, MD [facility]

## 2019-05-22 NOTE — Addendum Note (Signed)
Addended by: Alfredia Ferguson A on: 05/22/2019 05:17 PM   Modules accepted: Orders

## 2019-05-24 LAB — CBC WITH DIFFERENTIAL/PLATELET
Basophils Absolute: 0 10*3/uL (ref 0.0–0.2)
Basos: 0 %
EOS (ABSOLUTE): 0.2 10*3/uL (ref 0.0–0.4)
Eos: 2 %
Hematocrit: 40.7 % (ref 34.0–46.6)
Hemoglobin: 13.5 g/dL (ref 11.1–15.9)
Immature Grans (Abs): 0 10*3/uL (ref 0.0–0.1)
Immature Granulocytes: 0 %
Lymphocytes Absolute: 2.5 10*3/uL (ref 0.7–3.1)
Lymphs: 27 %
MCH: 29.5 pg (ref 26.6–33.0)
MCHC: 33.2 g/dL (ref 31.5–35.7)
MCV: 89 fL (ref 79–97)
Monocytes Absolute: 1 10*3/uL — ABNORMAL HIGH (ref 0.1–0.9)
Monocytes: 11 %
Neutrophils Absolute: 5.7 10*3/uL (ref 1.4–7.0)
Neutrophils: 60 %
Platelets: 295 10*3/uL (ref 150–450)
RBC: 4.58 x10E6/uL (ref 3.77–5.28)
RDW: 14.1 % (ref 11.7–15.4)
WBC: 9.5 10*3/uL (ref 3.4–10.8)

## 2019-05-24 LAB — COMPREHENSIVE METABOLIC PANEL
ALT: 97 IU/L — ABNORMAL HIGH (ref 0–32)
AST: 46 IU/L — ABNORMAL HIGH (ref 0–40)
Albumin/Globulin Ratio: 1.9 (ref 1.2–2.2)
Albumin: 4.3 g/dL (ref 3.8–4.9)
Alkaline Phosphatase: 47 IU/L (ref 39–117)
BUN/Creatinine Ratio: 13 (ref 9–23)
BUN: 8 mg/dL (ref 6–24)
Bilirubin Total: 0.3 mg/dL (ref 0.0–1.2)
CO2: 26 mmol/L (ref 20–29)
Calcium: 9.2 mg/dL (ref 8.7–10.2)
Chloride: 99 mmol/L (ref 96–106)
Creatinine, Ser: 0.61 mg/dL (ref 0.57–1.00)
GFR calc Af Amer: 120 mL/min/{1.73_m2} (ref >59)
GFR calc non Af Amer: 104 mL/min/{1.73_m2} (ref >59)
Globulin, Total: 2.3 g/dL (ref 1.5–4.5)
Glucose: 95 mg/dL (ref 65–99)
Potassium: 4.5 mmol/L (ref 3.5–5.2)
Sodium: 139 mmol/L (ref 134–144)
Total Protein: 6.6 g/dL (ref 6.0–8.5)

## 2019-05-24 LAB — ANA,IFA RA DIAG PNL W/RFLX TIT/PATN
ANA Titer 1: NEGATIVE
Cyclic Citrullin Peptide Ab: 6 units (ref 0–19)
Rheumatoid fact SerPl-aCnc: <10 IU/mL (ref 0.0–13.9)

## 2019-05-24 LAB — SEDIMENTATION RATE: Sed Rate: 8 mm/hr (ref 0–40)

## 2019-05-26 ENCOUNTER — Telehealth: Payer: Self-pay | Admitting: Emergency Medicine

## 2019-05-26 NOTE — Telephone Encounter (Signed)
Blood results discussed with patient. 

## 2019-05-27 ENCOUNTER — Telehealth: Payer: Self-pay | Admitting: *Deleted

## 2019-05-27 NOTE — Telephone Encounter (Signed)
Called patient to give lab results using [place] [phone]. Left message in mobile voice mail to call back.

## 2019-06-25 ENCOUNTER — Ambulatory Visit: Payer: BC Managed Care – PPO | Admitting: Emergency Medicine

## 2019-06-26 ENCOUNTER — Ambulatory Visit: Payer: BC Managed Care – PPO | Admitting: Emergency Medicine

## 2019-06-26 ENCOUNTER — Other Ambulatory Visit: Payer: Self-pay

## 2019-06-26 ENCOUNTER — Encounter: Payer: Self-pay | Admitting: Emergency Medicine

## 2019-06-26 VITALS — BP 139/68 | HR 69 | Temp 98.6°F | Resp 16 | Ht 60.0 in | Wt 229.6 lb

## 2019-06-26 DIAGNOSIS — Z6841 Body Mass Index (BMI) 40.0 and over, adult: Secondary | ICD-10-CM

## 2019-06-26 DIAGNOSIS — R7401 Elevation of levels of liver transaminase levels: Secondary | ICD-10-CM

## 2019-06-26 DIAGNOSIS — R52 Pain, unspecified: Secondary | ICD-10-CM | POA: Diagnosis not present

## 2019-06-26 DIAGNOSIS — R740 Nonspecific elevation of levels of transaminase and lactic acid dehydrogenase [LDH]: Secondary | ICD-10-CM | POA: Diagnosis not present

## 2019-06-26 NOTE — Progress Notes (Signed)
Isabel Deleon 53 y.o.   Chief Complaint  Patient presents with  . Follow-up    3-4 weeks to repeat labwork liver enzymes mildly elevated    HISTORY OF PRESENT ILLNESS: This is a 53 y.o. female complaining of achiness all over.  Seen by me on 05/22/2019 for the same and CMP showed elevated transaminases.  Negative sed rate and negative ANA.  Otherwise unremarkable blood work back then.  No new symptoms.  Here today for repeat liver enzyme test.  HPI   Prior to Admission medications   Medication Sig Start Date End Date Taking? Authorizing Provider  Multiple Vitamin (MULTIVITAMIN) tablet Take 1 tablet by mouth daily.   Yes [provider]  ibuprofen (ADVIL,MOTRIN) 800 MG tablet Take 1 tablet (800 mg total) by mouth every 8 (eight) hours as needed. Patient not taking: Reported on 06/26/2019 12/28/18   Rutherford Guys, MD    No Known Allergies  Patient Active Problem List   Diagnosis Date Noted  . Pre-diabetes 08/11/2017  . Chronic pain of toes of both feet 01/17/2017  . 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 Hyperlipidemia   . UTI (lower urinary tract infection)     Past Surgical History:  Procedure Laterality Date  . TUBAL LIGATION      Social History   Socioeconomic History  . Marital status: Married    Spouse name: Not on file  . Number of children: 5  . Years of education: Not on file  . Highest education level: Not on file  Occupational History  . Not on file  Social Needs  . Financial resource strain: Not on file  . Food insecurity    Worry: Not on file    Inability: Not on file  . Transportation needs    Medical: Not on file    Non-medical: Not on file  Tobacco Use  . Smoking status: Never Smoker  . Smokeless tobacco: Never Used  Substance and Sexual Activity  . Alcohol use: No  . Drug use: No  . Sexual activity: Yes    Partners: Male    Birth control/protection: Surgical  Lifestyle  . Physical  activity    Days per week: Not on file    Minutes per session: Not on file  . Stress: Not on file  Relationships  . Social Herbalist on phone: Not on file    Gets together: Not on file    Attends religious service: Not on file    Active member of club or organization: Not on file    Attends meetings of clubs or organizations: Not on file    Relationship status: Not on file  . Intimate partner violence    Fear of current or ex partner: Not on file    Emotionally abused: Not on file    Physically abused: Not on file    Forced sexual activity: Not on file  Other Topics Concern  . Not on file  Social History Narrative  . Not on file    Family History  Problem Relation Age of Onset  . Hypertension Mother      Review of Systems  Constitutional: Negative.  Negative for chills and fever.  HENT: Negative.  Negative for congestion and sore throat.   Eyes: Negative.   Respiratory: Negative.  Negative for cough and shortness of breath.   Cardiovascular: Negative.  Negative for chest pain and palpitations.  Gastrointestinal: Positive  for constipation. Negative for abdominal pain, blood in stool, diarrhea, nausea and vomiting.  Genitourinary: Negative for dysuria, frequency and hematuria.  Musculoskeletal: Positive for back pain and joint pain.  Skin: Negative.  Negative for rash.  Neurological: Negative.  Negative for dizziness and headaches.  All other systems reviewed and are negative.   Vitals:   06/26/19 0840  BP: 139/68  Pulse: 69  Resp: 16  Temp: 98.6 F (37 C)  SpO2: 98%    Physical Exam Vitals signs reviewed.  Constitutional:      Appearance: Normal appearance.  HENT:     Head: Normocephalic.  Eyes:     Extraocular Movements: Extraocular movements intact.     Conjunctiva/sclera: Conjunctivae normal.     Pupils: Pupils are equal, round, and reactive to light.  Neck:     Musculoskeletal: Normal range of motion and neck supple.  Cardiovascular:      Rate and Rhythm: Normal rate and regular rhythm.     Pulses: Normal pulses.     Heart sounds: Normal heart sounds.  Pulmonary:     Effort: Pulmonary effort is normal.     Breath sounds: Normal breath sounds.  Abdominal:     Palpations: Abdomen is soft.     Tenderness: There is no abdominal tenderness.  Musculoskeletal: Normal range of motion.  Skin:    General: Skin is warm and dry.  Neurological:     General: No focal deficit present.     Mental Status: She is alert and oriented to person, place, and time.  Psychiatric:        Mood and Affect: Mood normal.        Behavior: Behavior normal.      ASSESSMENT & PLAN: Isabel Deleon was seen today for follow-up.  Diagnoses and all orders for this visit:  Elevated transaminase level -     Comprehensive metabolic panel  Morbid obesity with BMI of 40.0-44.9, adult (HCC)  Generalized body aches    Patient Instructions   Mantenimiento de Technical sales engineer en [place] Maintenance, Female Adoptar un estilo de vida saludable y recibir atencin preventiva son importantes para promover la salud y Musician. Consulte al mdico sobre:  El esquema adecuado para hacerse pruebas y exmenes peridicos.  Cosas que puede hacer por su cuenta para prevenir enfermedades y [employer]. Qu debo saber sobre la dieta, el peso y el ejercicio? Consuma una dieta saludable   Consuma una dieta que incluya muchas verduras, frutas, productos lcteos con bajo contenido de [place] y Advertising account planner.  No consuma muchos alimentos ricos en grasas slidas, azcares agregados o sodio. Mantenga un peso saludable El ndice de masa muscular [facility]) se [place] para identificar problemas de [place]. Proporciona una estimacin de la grasa corporal basndose en el peso y la altura. Su mdico puede ayudarle a Radiation protection practitioner [place] y a Scientist, forensic o Theatre manager un peso saludable. Haga ejercicio con regularidad Haga ejercicio con regularidad. Esta es una de las prcticas ms  importantes que puede hacer por su salud. La mayora de los adultos deben seguir estas pautas:  Optometrist, al menos, 165minutos de actividad fsica por semana. El ejercicio debe aumentar la frecuencia cardaca y Nature conservation officer transpirar (ejercicio de intensidad moderada).  Hacer ejercicios de fortalecimiento por lo [employer] por semana. Agregue esto a su plan de ejercicio de intensidad moderada.  Pasar menos tiempo sentados. Incluso la actividad fsica ligera puede ser beneficiosa. Controle sus niveles de colesterol y lpidos en la sangre Comience a realizarse anlisis  de lpidos y colesterol en la sangre a los 20aos y luego reptalos cada 5aos. Hgase controlar los niveles de colesterol con mayor frecuencia si:  Sus niveles de lpidos y colesterol son altos.  Es mayor de 40aos.  Presenta un alto riesgo de padecer enfermedades cardacas. Qu debo saber sobre las pruebas de deteccin del cncer? Segn su historia clnica y sus antecedentes familiares, es posible que deba realizarse pruebas de deteccin del cncer en diferentes edades. Esto puede incluir pruebas de deteccin de lo siguiente:  Cncer de mama.  Cncer de cuello uterino.  Cncer colorrectal.  Cncer de piel.  Cncer de pulmn. Qu debo saber sobre la enfermedad cardaca, la diabetes y la hipertensin arterial? Presin arterial y enfermedad cardaca  La hipertensin arterial causa enfermedades cardacas y [place] el riesgo de accidente cerebrovascular. Es ms probable que esto se manifieste en las personas que tienen lecturas de presin arterial alta, tienen ascendencia africana o tienen sobrepeso.  Hgase controlar la presin arterial: ? Cada 3 a 5 aos si tiene entre 18 y 71 aos. ? Todos los aos si es mayor de [place]. Diabetes Realcese exmenes de deteccin de la diabetes con regularidad. Este anlisis revisa el nivel de azcar en la sangre en [place]. Hgase las pruebas de deteccin:  Cada tresaos  despus de los 41aos de edad si tiene un peso normal y un bajo riesgo de padecer diabetes.  Con ms frecuencia y a partir de [place] edad inferior si tiene sobrepeso o un alto riesgo de padecer diabetes. Qu debo saber sobre la prevencin de infecciones? Hepatitis B Si tiene un riesgo ms alto de contraer hepatitis B, debe someterse a un examen de deteccin de este virus. Hable con el mdico para averiguar si tiene riesgo de contraer la infeccin por hepatitis B. Hepatitis C Se recomienda el anlisis a:  [employer] 1945 y 1965.  Todas las personas que tengan un riesgo de haber contrado hepatitis C. Enfermedades de transmisin sexual (ETS)  Hgase las pruebas de Programme researcher, broadcasting/film/video de ITS, incluidas la gonorrea y la clamidia, si: ? Es sexualmente activa y es menor de [place]. ? Es mayor de 24aos, y Investment banker, operational informa que corre riesgo de tener este tipo de infecciones. ? La actividad sexual ha cambiado desde que le hicieron la ltima prueba de deteccin y tiene un riesgo mayor de Best boy clamidia o Radio broadcast assistant. Pregntele al mdico si usted tiene riesgo.  Pregntele al mdico si usted tiene un alto riesgo de Museum/gallery curator VIH. El mdico tambin puede recomendarle un medicamento recetado para ayudar a evitar la infeccin por el VIH. Si elige tomar medicamentos para prevenir el VIH, primero debe [employer] de deteccin del VIH. Luego debe hacerse anlisis cada 49meses mientras est tomando los medicamentos. Embarazo  Si est por dejar de Librarian, academic (fase premenopusica) y usted puede quedar [place], busque asesoramiento antes de [place].  Tome de 400 a [id number] (mcg) de cido [employer] si [place].  Pida mtodos de control de la natalidad (anticonceptivos) si desea evitar un embarazo no deseado. Osteoporosis y [place] La osteoporosis es una enfermedad en la que los huesos pierden los minerales y la fuerza por el avance de la edad. El  resultado pueden ser fracturas en los [place]. Si tiene 65aos o ms, o si est en riesgo de sufrir osteoporosis y fracturas, pregunte a su mdico si debe:  Hacerse pruebas de deteccin de prdida sea.  Tomar un suplemento de calcio o de vitamina D  para reducir Catering manager de fracturas.  Recibir terapia de reemplazo hormonal (TRH) para tratar los sntomas de la menopausia. Siga estas instrucciones en su casa: Estilo de vida  No consuma ningn producto que contenga nicotina o tabaco, como cigarrillos, cigarrillos electrnicos y tabaco de Higher education careers adviser. Si necesita ayuda para dejar de fumar, consulte al mdico.  No consuma drogas.  No comparta agujas.  Solicite ayuda a su mdico si necesita apoyo o informacin para abandonar las drogas. Consumo de alcohol  No beba alcohol si: ? Su mdico le indica no hacerlo. ? Est embarazada, puede estar embarazada o est tratando de quedar embarazada.  Si bebe alcohol: ? Limite la cantidad que consume de 0 a 1 medida por da. ? Limite la ingesta si est amamantando.  Est atento a la cantidad de alcohol que hay en las bebidas que toma. En los [place], una medida equivale a una botella de cerveza de 12oz (3102ml), un vaso de vino de 5oz (116ml) o un vaso de una bebida alcohlica de alta graduacin de 1oz (9ml). Instrucciones generales  Realcese los estudios de rutina de la salud, dentales y de Public librarian.  [place].  Infrmele a su mdico si: ? Se siente deprimida con frecuencia. ? Alguna vez ha sido vctima de [place] o no se siente segura en su casa. Resumen  Adoptar un estilo de vida saludable y recibir atencin preventiva son importantes para promover la salud y Musician.  Siga las instrucciones del mdico acerca de una dieta saludable, el ejercicio y la realizacin de pruebas o exmenes para Engineer, building services.  Siga las instrucciones del mdico con respecto al control del colesterol y la presin  arterial. Esta informacin no tiene Marine scientist el consejo del mdico. Asegrese de hacerle al mdico cualquier pregunta que tenga. Document Released: 09/21/2011 Document Revised: 10/23/2018 Document Reviewed: 10/23/2018 Elsevier Patient Education  [employer].     If you have lab work done today you will be contacted with your lab results within the next 2 weeks.  If you have not heard from [place] then please contact us. The fastest way to get your results is to register for My Chart.   IF you received an x-ray today, you will receive an invoice from [facility]. Please contact [facility] at [phone] with questions or concerns regarding your invoice.   IF you received labwork today, you will receive an invoice from [place]. Please contact [facility] at [phone] with questions or concerns regarding your invoice.   Our billing staff will not be able to assist you with questions regarding bills from these companies.  You will be contacted with the lab results as soon as they are available. The fastest way to get your results is to activate your My Chart account. Instructions are located on the last page of this paperwork. If you have not heard from [place] regarding the results in 2 weeks, please contact this office.         Agustina Caroli, MD [facility]

## 2019-06-26 NOTE — Patient Instructions (Addendum)
Mantenimiento de Technical sales engineer en [place] Maintenance, Female Adoptar un estilo de vida saludable y recibir atencin preventiva son importantes para promover la salud y Musician. Consulte al mdico sobre:  El esquema adecuado para hacerse pruebas y exmenes peridicos.  Cosas que puede hacer por su cuenta para prevenir enfermedades y [employer]. Qu debo saber sobre la dieta, el peso y el ejercicio? Consuma una dieta saludable   Consuma una dieta que incluya muchas verduras, frutas, productos lcteos con bajo contenido de [place] y Advertising account planner.  No consuma muchos alimentos ricos en grasas slidas, azcares agregados o sodio. Mantenga un peso saludable El ndice de masa muscular [facility]) se [place] and [place] para identificar problemas de [place]. Proporciona una estimacin de la grasa corporal basndose en el peso y la altura. Su mdico puede ayudarle a Radiation protection practitioner [place] y a Scientist, forensic o Theatre manager un peso saludable. Haga ejercicio con regularidad Haga ejercicio con regularidad. Esta es una de las prcticas ms importantes que puede hacer por su salud. La mayora de los adultos deben seguir estas pautas:  Optometrist, al menos, 185mnutos de actividad fsica por semana. El ejercicio debe aumentar la frecuencia cardaca y hNature conservation officertranspirar (ejercicio de intensidad moderada).  Hacer ejercicios de fortalecimiento por lo mHalliburton Companypor semana. Agregue esto a su plan de ejercicio de intensidad moderada.  Pasar menos tiempo sentados. Incluso la actividad fsica ligera puede ser beneficiosa. Controle sus niveles de colesterol y lpidos en la sangre Comience a realizarse anlisis de lpidos y cResearch officer, trade unionen la sangre a los 20aos y luego reptalos cada 5aos. Hgase controlar los niveles de colesterol con mayor frecuencia si:  Sus niveles de lpidos y colesterol son altos.  Es mayor de 40aos.  Presenta un alto riesgo de padecer enfermedades cardacas. Qu debo saber sobre las pruebas de  deteccin del cncer? Segn su historia clnica y sus antecedentes familiares, es posible que deba realizarse pruebas de deteccin del cncer en diferentes edades. Esto puede incluir pruebas de deteccin de lo siguiente:  Cncer de mama.  Cncer de cuello uterino.  Cncer colorrectal.  Cncer de piel.  Cncer de pulmn. Qu debo saber sobre la enfermedad cardaca, la diabetes y la hipertensin arterial? Presin arterial y enfermedad cardaca  La hipertensin arterial causa enfermedades cardacas y aSerbiael riesgo de accidente cerebrovascular. Es ms probable que esto se manifieste en las personas que tienen lecturas de presin arterial alta, tienen ascendencia africana o tienen sobrepeso.  Hgase controlar la presin arterial: ? Cada 3 a 5 aos si tiene entre 18 y 389aos. ? Todos los aos si es mayor de 4[place] Diabetes Realcese exmenes de deteccin de la diabetes con regularidad. Este anlisis revisa el nivel de azcar en la sangre en [place] Hgase las pruebas de deteccin:  Cada tresaos despus de los 428aosde edad si tiene un peso normal y un bajo riesgo de padecer diabetes.  Con ms frecuencia y a partir de uSt. Petersedad inferior si tiene sobrepeso o un alto riesgo de padecer diabetes. Qu debo saber sobre la prevencin de infecciones? Hepatitis B Si tiene un riesgo ms alto de contraer hepatitis B, debe someterse a un examen de deteccin de este virus. Hable con el mdico para averiguar si tiene riesgo de contraer la infeccin por hepatitis B. Hepatitis C Se recomienda el anlisis a:  [employer]1945 y 1965.  Todas las personas que tengan un riesgo de haber contrado hepatitis C. Enfermedades de transmisin sexual (ETS)  Hgase las  pruebas de deteccin de ITS, incluidas la gonorrea y la clamidia, si: ? Es sexualmente activa y es menor de [id number]. ? Es mayor de 24aos, y Investment banker, operational informa que corre riesgo de tener este tipo de  infecciones. ? La actividad sexual ha cambiado desde que le hicieron la ltima prueba de deteccin y tiene un riesgo mayor de Best boy clamidia o Radio broadcast assistant. Pregntele al mdico si usted tiene riesgo.  Pregntele al mdico si usted tiene un alto riesgo de Museum/gallery curator VIH. El mdico tambin puede recomendarle un medicamento recetado para ayudar a evitar la infeccin por el VIH. Si elige tomar medicamentos para prevenir el VIH, primero debe [employer] de deteccin del VIH. Luego debe hacerse anlisis cada 58meses mientras est tomando los medicamentos. Embarazo  Si est por dejar de Librarian, academic (fase premenopusica) y usted puede quedar [place], busque asesoramiento antes de [place].  Tome de 400 a [id number] (mcg) de cido [employer] si [place].  Pida mtodos de control de la natalidad (anticonceptivos) si desea evitar un embarazo no deseado. Osteoporosis y [place] La osteoporosis es una enfermedad en la que los huesos pierden los minerales y la fuerza por el avance de la edad. El resultado pueden ser fracturas en los [place]. Si tiene 65aos o ms, o si est en riesgo de sufrir osteoporosis y fracturas, pregunte a su mdico si debe:  Hacerse pruebas de deteccin de prdida sea.  Tomar un suplemento de calcio o de vitamina D para reducir el riesgo de fracturas.  Recibir terapia de reemplazo hormonal (TRH) para tratar los sntomas de la menopausia. Siga estas instrucciones en su casa: Estilo de vida  No consuma ningn producto que contenga nicotina o tabaco, como cigarrillos, cigarrillos electrnicos y tabaco de Higher education careers adviser. Si necesita ayuda para dejar de fumar, consulte al mdico.  No consuma drogas.  No comparta agujas.  Solicite ayuda a su mdico si necesita apoyo o informacin para abandonar las drogas. Consumo de alcohol  No beba alcohol si: ? Su mdico le indica no hacerlo. ? Est embarazada, puede estar embarazada o est tratando de quedar  embarazada.  Si bebe alcohol: ? Limite la cantidad que consume de 0 a 1 medida por da. ? Limite la ingesta si est amamantando.  Est atento a la cantidad de alcohol que hay en las bebidas que toma. En los [place], una medida equivale a una botella de cerveza de 12oz (331ml), un vaso de vino de 5oz (153ml) o un vaso de una bebida alcohlica de alta graduacin de 1oz (85ml). Instrucciones generales  Realcese los estudios de rutina de la salud, dentales y de Public librarian.  [place].  Infrmele a su mdico si: ? Se siente deprimida con frecuencia. ? Alguna vez ha sido vctima de [place] o no se siente segura en su casa. Resumen  Adoptar un estilo de vida saludable y recibir atencin preventiva son importantes para promover la salud y Musician.  Siga las instrucciones del mdico acerca de una dieta saludable, el ejercicio y la realizacin de pruebas o exmenes para Engineer, building services.  Siga las instrucciones del mdico con respecto al control del colesterol y la presin arterial. Esta informacin no tiene Marine scientist el consejo del mdico. Asegrese de hacerle al mdico cualquier pregunta que tenga. Document Released: 09/21/2011 Document Revised: 10/23/2018 Document Reviewed: 10/23/2018 Elsevier Patient Education  [employer].     If you have lab work done today you will be contacted  with your lab results within the next 2 weeks.  If you have not heard from [place] then please contact us. The fastest way to get your results is to register for My Chart.   IF you received an x-ray today, you will receive an invoice from [facility] Rad[facility]. Please contact [facility] Radiology at [phone] with questions or concerns regarding your invoice.   IF you received labwork today, you will receive an invoice from [place]. Please contact [facility] at [phone] with questions or concerns regarding your invoice.   Our billing staff will  not be able to assist you with questions regarding bills from these companies.  You will be contacted with the lab results as soon as they are available. The fastest way to get your results is to activate your My Chart account. Instructions are located on the last page of this paperwork. If you have not heard from [place] regarding the results in 2 weeks, please contact this office.

## 2019-06-27 ENCOUNTER — Telehealth: Payer: Self-pay | Admitting: Emergency Medicine

## 2019-06-27 LAB — COMPREHENSIVE METABOLIC PANEL
ALT: 48 IU/L — ABNORMAL HIGH (ref 0–32)
AST: 28 IU/L (ref 0–40)
Albumin/Globulin Ratio: 1.6 (ref 1.2–2.2)
Albumin: 4.4 g/dL (ref 3.8–4.9)
Alkaline Phosphatase: 66 IU/L (ref 39–117)
BUN/Creatinine Ratio: 17 (ref 9–23)
BUN: 12 mg/dL (ref 6–24)
Bilirubin Total: 0.3 mg/dL (ref 0.0–1.2)
CO2: 24 mmol/L (ref 20–29)
Calcium: 9.1 mg/dL (ref 8.7–10.2)
Chloride: 101 mmol/L (ref 96–106)
Creatinine, Ser: 0.7 mg/dL (ref 0.57–1.00)
GFR calc Af Amer: 114 mL/min/{1.73_m2} (ref >59)
GFR calc non Af Amer: 99 mL/min/{1.73_m2} (ref >59)
Globulin, Total: 2.7 g/dL (ref 1.5–4.5)
Glucose: 103 mg/dL — ABNORMAL HIGH (ref 65–99)
Potassium: 4.4 mmol/L (ref 3.5–5.2)
Sodium: 140 mmol/L (ref 134–144)
Total Protein: 7.1 g/dL (ref 6.0–8.5)

## 2019-06-27 NOTE — Telephone Encounter (Signed)
Blood results discussed with patient.  Much improved liver enzymes.

## 2019-09-15 ENCOUNTER — Telehealth: Payer: Self-pay

## 2019-09-15 DIAGNOSIS — Z1211 Encounter for screening for malignant neoplasm of colon: Secondary | ICD-10-CM

## 2019-09-15 NOTE — Telephone Encounter (Signed)
Pt. Called inquiring about a possible referral for a colonoscopy.   Please Advise

## 2019-09-23 NOTE — Telephone Encounter (Signed)
Please advise 

## 2019-09-25 ENCOUNTER — Telehealth: Payer: Self-pay | Admitting: Family Medicine

## 2019-09-25 NOTE — Telephone Encounter (Signed)
[place] # [phone] called pt an advised referral has been made for [facility] and they will contact her with appt.  Pt agreeable and verbalized understanding. Dgaddy, CMA

## 2019-09-25 NOTE — Telephone Encounter (Signed)
Pt says that the office to where she was referred to needs the records sent from the last referral which was for the same procedure just different place   Please advise   [phone]

## 2019-09-25 NOTE — Telephone Encounter (Signed)
Please let patient know referral was made. She should get a phone call from [place]. thanks

## 2019-09-30 NOTE — Telephone Encounter (Signed)
Pt called in again stating the office she had been referred to is asking for her records to be given to them from previous colonoscopy done. Please advise.

## 2019-10-01 NOTE — Telephone Encounter (Signed)
Pt said she had a colonoscopy done 5 years ago and the referral office will not make her an appointment without those records. Please assist. This is her third call to the office

## 2019-10-03 NOTE — Telephone Encounter (Signed)
Isabel Deleon is going to call her and speak with her

## 2019-10-14 ENCOUNTER — Telehealth: Payer: Self-pay | Admitting: Gastroenterology

## 2019-10-14 NOTE — Telephone Encounter (Signed)
Hi Dr. Silverio Decamp, we have received a referral from patient's PCP for a repeat colon. Patient had colon in 2014. Records have been received and they will be sent to you for review. Please advise on scheduling. Thank you.

## 2019-10-29 NOTE — Telephone Encounter (Signed)
Dr. Silverio Decamp  reviewed patient's records and recommended direct colon. A message was left in patient's phone to call back to schedule appointment.

## 2019-11-18 ENCOUNTER — Encounter: Payer: Self-pay | Admitting: Gastroenterology

## 2019-12-13 ENCOUNTER — Ambulatory Visit: Payer: BC Managed Care – PPO | Attending: Internal Medicine

## 2019-12-13 DIAGNOSIS — Z23 Encounter for immunization: Secondary | ICD-10-CM | POA: Insufficient documentation

## 2019-12-13 NOTE — Progress Notes (Signed)
   Covid-19 Vaccination Clinic  Name:  Isabel Deleon    MRN: [id number] DOB: 05/01/1966  12/13/2019  [name] was observed post Covid-19 immunization for 15 minutes without incidence. She was provided with Vaccine Information Sheet and instruction to access the V-Safe system.   [name] was instructed to call 911 with any severe reactions post vaccine: Marland Kitchen Difficulty breathing  . Swelling of your face and throat  . A fast heartbeat  . A bad rash all over your body  . Dizziness and weakness    Immunizations Administered    Name Date Dose VIS Date Route   Pfizer COVID-19 Vaccine 12/13/2019  5:46 PM 0.3 mL 09/26/2019 Intramuscular   Manufacturer: [place]   Lot: [id number]   [place]: [id number]

## 2019-12-19 ENCOUNTER — Other Ambulatory Visit: Payer: Self-pay

## 2019-12-19 ENCOUNTER — Ambulatory Visit (AMBULATORY_SURGERY_CENTER): Payer: Self-pay | Admitting: *Deleted

## 2019-12-19 VITALS — Temp 97.2°F | Ht 60.0 in | Wt 236.0 lb

## 2019-12-19 DIAGNOSIS — Z01818 Encounter for other preprocedural examination: Secondary | ICD-10-CM

## 2019-12-19 DIAGNOSIS — Z86010 Personal history of colonic polyps: Secondary | ICD-10-CM

## 2019-12-19 MED ORDER — SUPREP BOWEL PREP KIT 17.5-3.13-1.6 GM/177ML PO SOLN: 1.0000 | Freq: Once | ORAL | 0 refills | Status: AC

## 2019-12-19 NOTE — Progress Notes (Signed)
Interpreter  Temp 97.5 and Daughter temp 97.5  in [place] with pt today   No egg or soy allergy known to patient  No issues with past sedation with any surgeries  or procedures, no intubation problems  No diet pills per patient No home 02 use per patient  No blood thinners per patient  Pt states issues with constipation - pt states she has a BM every day but are hard balls and she strains a lot -uses stool softener sometimes - will do a 2 day Suprep for her- I did in [place] as her daughter stated in [place] she will help pt with prep instructions as she reads and speaks [place] and Spanish-  No A fib or A flutter  EMMI video sent to pt's e mail   Due to the COVID-19 pandemic we are asking patients to follow these guidelines. Please only bring one care partner. Please be aware that your care partner may wait in the car in the parking lot or if they feel like they will be too hot to wait in the car, they may wait in the lobby on the 4th floor. All care partners are required to wear a mask the entire time (we do not have any that we can provide them), they need to practice social distancing, and we will do a Covid check for all patient's and care partners when you arrive. Also we will check their temperature and your temperature. If the care partner waits in their car they need to stay in the parking lot the entire time and we will call them on their cell phone when the patient is ready for discharge so they can bring the car to the front of the building. Also all patient's will need to wear a mask into building.   Suprep Sample [id number] exp 07/2021  After we started 2 day Suprep instructions pt stated she has not had constipation in 1 week due to vinegar she is taking- explained to her we still need to do a 2 day prep to make sure she is cleansed out good for her colon due to previous constipation issues (hard balls daily ) - I explain to daughter the importance of this 2 day prep and that if she does not do as  instructed and she I not cleaned out, insurance will not pay for a second colon- they both verbalized understanding of this

## 2020-01-01 ENCOUNTER — Other Ambulatory Visit: Payer: Self-pay | Admitting: Gastroenterology

## 2020-01-01 ENCOUNTER — Ambulatory Visit (INDEPENDENT_AMBULATORY_CARE_PROVIDER_SITE_OTHER): Payer: BC Managed Care – PPO

## 2020-01-01 ENCOUNTER — Encounter: Payer: Self-pay | Admitting: Gastroenterology

## 2020-01-01 DIAGNOSIS — Z1159 Encounter for screening for other viral diseases: Secondary | ICD-10-CM

## 2020-01-01 LAB — SARS CORONAVIRUS 2 (TAT 6-24 HRS): SARS Coronavirus 2: NEGATIVE

## 2020-01-03 ENCOUNTER — Ambulatory Visit: Payer: BC Managed Care – PPO | Attending: Internal Medicine

## 2020-01-03 DIAGNOSIS — Z23 Encounter for immunization: Secondary | ICD-10-CM

## 2020-01-03 NOTE — Progress Notes (Signed)
   Covid-19 Vaccination Clinic  Name:  Aletha Edmonson    MRN: [id number] DOB: 03-11-1966  01/03/2020  Ms. Hobson was observed post Covid-19 immunization for 15 minutes without incident. She was provided with Vaccine Information Sheet and instruction to access the V-Safe system.   Ms. Laspisa was instructed to call 911 with any severe reactions post vaccine: Marland Kitchen Difficulty breathing  . Swelling of face and throat  . A fast heartbeat  . A bad rash all over body  . Dizziness and weakness   Immunizations Administered    Name Date Dose VIS Date Route   Pfizer COVID-19 Vaccine 01/03/2020  2:54 PM 0.3 mL 09/26/2019 Intramuscular   Manufacturer: [place]   Lot: [id number]   [place]: [id number]

## 2020-01-05 ENCOUNTER — Other Ambulatory Visit: Payer: Self-pay

## 2020-01-05 ENCOUNTER — Encounter: Payer: Self-pay | Admitting: Gastroenterology

## 2020-01-05 ENCOUNTER — Ambulatory Visit (AMBULATORY_SURGERY_CENTER): Payer: BC Managed Care – PPO | Admitting: Gastroenterology

## 2020-01-05 VITALS — BP 130/73 | HR 63 | Temp 97.7°F | Resp 21 | Ht 60.0 in | Wt 236.0 lb

## 2020-01-05 DIAGNOSIS — Z86010 Personal history of colonic polyps: Secondary | ICD-10-CM

## 2020-01-05 DIAGNOSIS — D123 Benign neoplasm of transverse colon: Secondary | ICD-10-CM | POA: Diagnosis not present

## 2020-01-05 MED ORDER — SODIUM CHLORIDE 0.9 % IV SOLN: 500.0000 mL | Freq: Once | INTRAVENOUS | Status: DC

## 2020-01-05 NOTE — Progress Notes (Signed)
Report to PACU, RN, vss, BBS= Clear.  

## 2020-01-05 NOTE — Progress Notes (Signed)
Called to room to assist during endoscopic procedure.  Patient ID and intended procedure confirmed with present staff. Received instructions for my participation in the procedure from the performing physician.  

## 2020-01-05 NOTE — Op Note (Signed)
[facility] Patient Name: Isabel Deleon Procedure Date: 01/05/2020 8:15 AM MRN: [id number] Endoscopist: Mauri Pole , MD Age: 54 Referring MD:  Date of Birth: [birth year] Gender: Female Account #: [id number] Procedure:                Colonoscopy Indications:              High risk colon cancer surveillance: Personal                            history of colonic polyps, Last colonoscopy: 2014 Medicines:                Monitored Anesthesia Care Procedure:                Pre-Anesthesia Assessment:                           - Prior to the procedure, a History and Physical                            was performed, and patient medications and                            allergies were reviewed. The patient's tolerance of                            previous anesthesia was also reviewed. The risks                            and benefits of the procedure and the sedation                            options and risks were discussed with the patient.                            All questions were answered, and informed consent                            was obtained. Prior Anticoagulants: The patient has                            taken no previous anticoagulant or antiplatelet                            agents. ASA Grade Assessment: II - A patient with                            mild systemic disease. After reviewing the risks                            and benefits, the patient was deemed in                            satisfactory condition to undergo the procedure.  After obtaining informed consent, the colonoscope                            was passed under direct vision. Throughout the                            procedure, the patient's blood pressure, pulse, and                            oxygen saturations were monitored continuously. The                            Colonoscope was introduced through the anus and   advanced to the the terminal ileum, with                            identification of the appendiceal orifice and IC                            valve. The colonoscopy was performed without                            difficulty. The patient tolerated the procedure                            well. The quality of the bowel preparation was                            excellent. The ileocecal valve, appendiceal                            orifice, and rectum were photographed. Scope In: 8:17:42 AM Scope Out: 8:33:17 AM Scope Withdrawal Time: 0 hours 12 minutes 41 seconds  Total Procedure Duration: 0 hours 15 minutes 35 seconds  Findings:                 The perianal and digital rectal examinations were                            normal.                           Five sessile polyps were found in the transverse                            colon. The polyps were 4 to 6 mm in size. These                            polyps were removed with a cold snare. Resection                            and retrieval were complete.                           A less than 1 mm polyp was  found in the transverse                            colon. The polyp was sessile. The polyp was removed                            with a cold biopsy forceps. Resection and retrieval                            were complete.                           Non-bleeding internal hemorrhoids were found during                            retroflexion. The hemorrhoids were small. Complications:            No immediate complications. Estimated Blood Loss:     Estimated blood loss was minimal. Impression:               - Five 4 to 6 mm polyps in the transverse colon,                            removed with a cold snare. Resected and retrieved.                           - One less than 1 mm polyp in the transverse colon,                            removed with a cold biopsy forceps. Resected and                            retrieved.                            - Non-bleeding internal hemorrhoids. Recommendation:           - Patient has a contact number available for                            emergencies. The signs and symptoms of potential                            delayed complications were discussed with the                            patient. Return to normal activities tomorrow.                            Written discharge instructions were provided to the                            patient.                           - Resume previous diet.                           -  Continue present medications.                           - Await pathology results.                           - Repeat colonoscopy in 3 - 5 years for                            surveillance based on pathology results. Mauri Pole, MD 01/05/2020 8:39:29 AM This report has been signed electronically.

## 2020-01-05 NOTE — Patient Instructions (Signed)
Handouts provided on polyps and hemorrhoids.   USTED TUVO UN PROCEDIMIENTO ENDOSCPICO HOY EN EL [facility]:   Lea el informe del procedimiento que se le entreg para cualquier pregunta especfica sobre lo que se Primary school teacher.  Si el informe del examen no responde a sus preguntas, por favor llame a su gastroenterlogo para aclararlo.  Si usted solicit que no se le den [employer] de lo que se Estate manager/land agent en su procedimiento al [employer] va a cuidar, entonces el informe del procedimiento se ha incluido en un sobre sellado para que usted lo revise despus cuando le sea ms conveniente.   LO QUE PUEDE ESPERAR: Algunas sensaciones de hinchazn en el abdomen.  Puede tener ms gases de lo normal.  El caminar puede ayudarle a eliminar el aire que se le puso en el tracto gastrointestinal durante el procedimiento y reducir la hinchazn.  Si le hicieron una endoscopia inferior (como una colonoscopia o una sigmoidoscopia flexible), podra notar manchas de sangre en las heces fecales o en el papel higinico.  Si se someti a una preparacin intestinal para su procedimiento, es posible que no tenga una evacuacin intestinal normal durante [employer].   Tenga en cuenta:  Es posible que note un poco de irritacin y congestin en la nariz o algn drenaje.  Esto es debido al oxgeno [employer] durante su procedimiento.  No hay que preocuparse y esto debe desaparecer ms o Scientist, research (medical).   SNTOMAS PARA REPORTAR INMEDIATAMENTE:  Despus de una endoscopia inferior (colonoscopia o sigmoidoscopia flexible):  Cantidades excesivas de sangre en las heces fecales  Sensibilidad significativa o empeoramiento de los dolores abdominales   Hinchazn aguda del abdomen que antes no tena   Fiebre de 100F o ms     Para asuntos urgentes o de Freight forwarder, puede comunicarse con un gastroenterlogo a cualquier hora llamando al [phone].  DIETA:  Recomendamos una comida pequea al principio,  pero luego puede continuar con su dieta normal.  Tome muchos lquidos, Teacher, adult education las bebidas alcohlicas durante 24 horas.    ACTIVIDAD:  Debe planear tomarse las cosas con calma por el resto del da y no debe CONDUCIR ni usar maquinaria pesada Programmer, applications (debido a los medicamentos de sedacin utilizados durante el examen).     SEGUIMIENTO: Nuestro personal llamar al nmero que aparece en su historial al siguiente da hbil de su procedimiento para ver cmo se siente y para responder cualquier pregunta o inquietud que pueda tener con respecto a la informacin que se le dio despus del procedimiento. Si no podemos contactarle, le dejaremos un mensaje.  Sin embargo, si se siente bien y no tiene Paediatric nurse, no es necesario que nos devuelva la llamada.  Asumiremos que ha regresado a sus actividades diarias normales sin incidentes. Si se le tomaron algunas biopsias, le contactaremos por telfono o por carta en las prximas 3 semanas.  Si no ha sabido [employer] biopsias en el transcurso de 3 semanas, por favor llmenos al [phone].   FIRMAS/CONFIDENCIALIDAD: Usted y/o el acompaante que le cuide han firmado documentos que se ingresarn en su historial mdico electrnico.  Estas firmas atestiguan el hecho de que la informacin anterior

## 2020-01-05 NOTE — Progress Notes (Signed)
Temp by Ledora Bottcher by CW  Pt's states no medical or surgical changes since previsit or office visit.   Admission completed with interpreter Rosemarie Ax

## 2020-01-07 ENCOUNTER — Telehealth: Payer: Self-pay

## 2020-01-07 NOTE — Telephone Encounter (Signed)
Could not leave VM

## 2020-01-07 NOTE — Telephone Encounter (Signed)
LVM

## 2020-01-09 ENCOUNTER — Encounter: Payer: Self-pay | Admitting: Gastroenterology

## 2020-01-15 ENCOUNTER — Telehealth: Payer: Self-pay | Admitting: Gastroenterology

## 2020-01-15 NOTE — Telephone Encounter (Signed)
Patient calling for bx results

## 2020-01-15 NOTE — Telephone Encounter (Signed)
With the help of Alphonse Guild interpreting, the patient is advised "no cancer" "the polyps removed were the kind that we are looking for and they are removed" "recall 12/2022."  Patient reports she had abdominal discomfort for 2 days after her procedure and she is better now.

## 2020-02-11 ENCOUNTER — Encounter: Payer: Self-pay | Admitting: Emergency Medicine

## 2020-02-11 ENCOUNTER — Other Ambulatory Visit: Payer: Self-pay

## 2020-02-11 ENCOUNTER — Telehealth (INDEPENDENT_AMBULATORY_CARE_PROVIDER_SITE_OTHER): Payer: BC Managed Care – PPO | Admitting: Emergency Medicine

## 2020-02-11 ENCOUNTER — Telehealth: Payer: Self-pay | Admitting: Emergency Medicine

## 2020-02-11 VITALS — Ht 60.0 in | Wt 236.0 lb

## 2020-02-11 DIAGNOSIS — R635 Abnormal weight gain: Secondary | ICD-10-CM | POA: Diagnosis not present

## 2020-02-11 DIAGNOSIS — Z6841 Body Mass Index (BMI) 40.0 and over, adult: Secondary | ICD-10-CM | POA: Diagnosis not present

## 2020-02-11 DIAGNOSIS — R7303 Prediabetes: Secondary | ICD-10-CM | POA: Diagnosis not present

## 2020-02-11 NOTE — Progress Notes (Signed)
Telemedicine Encounter- SOAP NOTE Established Patient MyChart video encounter unsuccessful. This telephone encounter was conducted with the patient's (or proxy's) verbal consent via audio telecommunications: yes/no: Yes Patient was instructed to have this encounter in a suitably private space; and to only have persons present to whom they give permission to participate. In addition, patient identity was confirmed by use of name plus two identifiers (DOB and address).  I discussed the limitations, risks, security and privacy concerns of performing an evaluation and management service by telephone and the availability of in person appointments. I also discussed with the patient that there may be a patient responsible charge related to this service. The patient expressed understanding and agreed to proceed.  I spent a total of TIME; 0 MIN TO 60 MIN: 15 minutes talking with the patient or their proxy.  Chief Complaint  Patient presents with  . Weight Loss    referral to nutrionist and feels anxious about eating     Subjective   Isabel Deleon is a 54 y.o. female established patient. Telephone visit today complaining of excessive weight gain with increased appetite.  Requesting referral to nutritional services. No other complaints or medical concerns today.  HPI   Patient Active Problem List   Diagnosis Date Noted  . Pre-diabetes 08/11/2017  . Chronic pain of toes of both feet 01/17/2017  . 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 Constipation   . GERD (gastroesophageal reflux disease)    burning in her chest/ throat with diet but never seen dr about this   . Hyperlipidemia    pt denies this finding   . UTI (lower urinary tract infection)     Current Outpatient Medications  Medication Sig Dispense Refill  . Multiple Vitamin (MULTIVITAMIN) tablet Take 1 tablet by mouth daily.    Marland Kitchen ibuprofen (ADVIL,MOTRIN) 800 MG tablet Take 1 tablet  (800 mg total) by mouth every 8 (eight) hours as needed. (Patient not taking: Reported on 02/11/2020) 30 tablet 1   No current facility-administered medications for this visit.    No Known Allergies  Social History   Socioeconomic History  . Marital status: Married    Spouse name: Not on file  . Number of children: 5  . Years of education: Not on file  . Highest education level: Not on file  Occupational History  . Not on file  Tobacco Use  . Smoking status: Never Smoker  . Smokeless tobacco: Never Used  Substance and Sexual Activity  . Alcohol use: No  . Drug use: No  . Sexual activity: Yes    Partners: Male    Birth control/protection: Surgical  Other Topics Concern  . Not on file  Social History Narrative  . Not on file   Social Determinants of Health   Financial Resource Strain:   . Difficulty of Paying Living Expenses:   Food Insecurity:   . Worried About Charity fundraiser in the Last Year:   . Arboriculturist in the Last Year:   Transportation Needs:   . Film/video editor (Medical):   Marland Kitchen Lack of Transportation (Non-Medical):   Physical Activity:   . Days of Exercise per Week:   . Minutes of Exercise per Session:   Stress:   . Feeling of Stress :   Social Connections:   . Frequency of Communication with Friends and Family:   . Frequency of Social Gatherings with Friends and Family:   .  Attends Religious Services:   . Active Member of Clubs or Organizations:   . Attends Archivist Meetings:   Marland Kitchen Marital Status:   Intimate Partner Violence:   . Fear of Current or Ex-Partner:   . Emotionally Abused:   Marland Kitchen Physically Abused:   . Sexually Abused:     Review of Systems  Constitutional: Negative.  Negative for chills and fever.  HENT: Negative.  Negative for congestion and sore throat.   Respiratory: Negative.  Negative for cough and shortness of breath.   Cardiovascular: Negative.  Negative for chest pain and palpitations.  Gastrointestinal:  Positive for heartburn. Negative for abdominal pain, blood in stool, melena, nausea and vomiting.  Genitourinary: Negative.  Negative for dysuria and hematuria.  Musculoskeletal: Negative.  Negative for back pain, myalgias and neck pain.  Skin: Negative.  Negative for rash.  Neurological: Negative.  Negative for dizziness and headaches.  All other systems reviewed and are negative.   Objective  Alert and oriented x3 in no apparent respiratory distress. Vitals as reported by the patient: Today's Vitals   02/11/20 0818  Weight: 236 lb (107 kg)  Height: 5' (1.524 m)    There are no diagnoses linked to this encounter. Isabel Deleon was seen today for weight loss.  Diagnoses and all orders for this visit:  Weight gain -     Amb ref to Medical Nutrition Therapy-MNT  Morbid obesity with BMI of 40.0-44.9, adult ([place])  Pre-diabetes     I discussed the assessment and treatment plan with the patient. The patient was provided an opportunity to ask questions and all were answered. The patient agreed with the plan and demonstrated an understanding of the instructions.   The patient was advised to call back or seek an in-person evaluation if the symptoms worsen or if the condition fails to improve as anticipated.  I provided 15 minutes of non-face-to-face time during this encounter.  Horald Pollen, MD  Primary Care at [facility]

## 2020-02-11 NOTE — Telephone Encounter (Signed)
Called pt to make appt pt stated she will give [place] a call back to make that 3 month f/u

## 2020-02-11 NOTE — Telephone Encounter (Signed)
MyChart video invitation not accepted. Phone visit instead.

## 2020-03-16 ENCOUNTER — Telehealth: Payer: Self-pay

## 2020-03-16 NOTE — Telephone Encounter (Signed)
Pt requesting a call back to make an appointment

## 2020-03-22 ENCOUNTER — Other Ambulatory Visit: Payer: Self-pay

## 2020-03-22 ENCOUNTER — Encounter: Payer: Self-pay | Admitting: Emergency Medicine

## 2020-03-22 ENCOUNTER — Ambulatory Visit: Payer: BC Managed Care – PPO | Admitting: Emergency Medicine

## 2020-03-22 VITALS — BP 125/77 | HR 84 | Temp 98.3°F | Resp 16 | Ht 60.0 in | Wt 227.0 lb

## 2020-03-22 DIAGNOSIS — R300 Dysuria: Secondary | ICD-10-CM

## 2020-03-22 DIAGNOSIS — M549 Dorsalgia, unspecified: Secondary | ICD-10-CM

## 2020-03-22 DIAGNOSIS — N939 Abnormal uterine and vaginal bleeding, unspecified: Secondary | ICD-10-CM | POA: Diagnosis not present

## 2020-03-22 DIAGNOSIS — R102 Pelvic and perineal pain: Secondary | ICD-10-CM

## 2020-03-22 LAB — POCT URINALYSIS DIP (MANUAL ENTRY)
Bilirubin, UA: NEGATIVE
Blood, UA: NEGATIVE
Glucose, UA: NEGATIVE mg/dL
Ketones, POC UA: NEGATIVE mg/dL
Leukocytes, UA: NEGATIVE
Nitrite, UA: NEGATIVE
Protein Ur, POC: NEGATIVE mg/dL
Spec Grav, UA: 1.02 (ref 1.010–1.025)
Urobilinogen, UA: 1 E.U./dL
pH, UA: 8 (ref 5.0–8.0)

## 2020-03-22 LAB — POC MICROSCOPIC URINALYSIS (UMFC): Mucus: ABSENT

## 2020-03-22 MED ORDER — SULFAMETHOXAZOLE-TRIMETHOPRIM 800-160 MG PO TABS: 1.0000 | ORAL_TABLET | Freq: Two times a day (BID) | ORAL | 0 refills | Status: AC

## 2020-03-22 NOTE — Progress Notes (Signed)
Isabel Deleon 54 y.o.   Chief Complaint  Patient presents with  . Dysuria    per pt for 2 weeks with little blood in urine  . Back Pain    and pelvic pain    HISTORY OF PRESENT ILLNESS: This is a 54 y.o. female complaining of pelvic pain and dysuria for 2 weeks. Has had left labial discomfort on and off for the past 3 years. Had abnormal vaginal bleeding for 3 days 2 weeks ago. No other complaints or significant symptoms.  HPI   Prior to Admission medications   Medication Sig Start Date End Date Taking? Authorizing Provider  ibuprofen (ADVIL,MOTRIN) 800 MG tablet Take 1 tablet (800 mg total) by mouth every 8 (eight) hours as needed. 12/28/18  Yes Rutherford Guys, MD  Multiple Vitamin (MULTIVITAMIN) tablet Take 1 tablet by mouth daily.   Yes [provider]    No Known Allergies  Patient Active Problem List   Diagnosis Date Noted  . Pre-diabetes 08/11/2017  . Chronic pain of toes of both feet 01/17/2017    Past Medical History:  Diagnosis Date  . Constipation   . GERD (gastroesophageal reflux disease)    burning in her chest/ throat with diet but never seen dr about this   . Hyperlipidemia    pt denies this finding   . UTI (lower urinary tract infection)     Past Surgical History:  Procedure Laterality Date  . COLONOSCOPY    . POLYPECTOMY    . TUBAL LIGATION      Social History   Socioeconomic History  . Marital status: Married    Spouse name: Not on file  . Number of children: 5  . Years of education: Not on file  . Highest education level: Not on file  Occupational History  . Not on file  Tobacco Use  . Smoking status: Never Smoker  . Smokeless tobacco: Never Used  Substance and Sexual Activity  . Alcohol use: No  . Drug use: No  . Sexual activity: Yes    Partners: Male    Birth control/protection: Surgical  Other Topics Concern  . Not on file  Social History Narrative  . Not on file   Social Determinants of Health    Financial Resource Strain:   . Difficulty of Paying Living Expenses:   Food Insecurity:   . Worried About Charity fundraiser in the Last Year:   . Arboriculturist in the Last Year:   Transportation Needs:   . Film/video editor (Medical):   Marland Kitchen Lack of Transportation (Non-Medical):   Physical Activity:   . Days of Exercise per Week:   . Minutes of Exercise per Session:   Stress:   . Feeling of Stress :   Social Connections:   . Frequency of Communication with Friends and Family:   . Frequency of Social Gatherings with Friends and Family:   . Attends Religious Services:   . Active Member of Clubs or Organizations:   . Attends Archivist Meetings:   Marland Kitchen Marital Status:   Intimate Partner Violence:   . Fear of Current or Ex-Partner:   . Emotionally Abused:   Marland Kitchen Physically Abused:   . Sexually Abused:     Family History  Problem Relation Age of Onset  . Hypertension Mother   . Stomach cancer Maternal Aunt   . Colon cancer Neg Hx   . Colon polyps Neg Hx   . Esophageal cancer Neg Hx   .  Rectal cancer Neg Hx      Review of Systems  Constitutional: Negative.  Negative for chills and fever.  HENT: Negative.  Negative for congestion and sore throat.   Eyes: Negative.   Respiratory: Negative.  Negative for cough and shortness of breath.   Cardiovascular: Negative.  Negative for chest pain and palpitations.  Gastrointestinal: Negative.  Negative for abdominal pain, diarrhea, nausea and vomiting.  Genitourinary: Positive for dysuria, frequency and hematuria.  Musculoskeletal: Negative.  Negative for myalgias and neck pain.  Skin: Negative.  Negative for rash.  Neurological: Negative.  Negative for dizziness and headaches.  All other systems reviewed and are negative.  Today's Vitals   03/22/20 1443  BP: 125/77  Pulse: 84  Resp: 16  Temp: 98.3 F (36.8 C)  TempSrc: Temporal  SpO2: 97%  Weight: 227 lb (103 kg)  Height: 5' (1.524 m)   Body mass index is  44.33 kg/m.   Physical Exam Vitals reviewed.  Constitutional:      Appearance: Normal appearance. She is obese.  HENT:     Head: Normocephalic.     Mouth/Throat:     Mouth: Mucous membranes are moist.     Pharynx: Oropharynx is clear.  Eyes:     Extraocular Movements: Extraocular movements intact.     Pupils: Pupils are equal, round, and reactive to light.  Cardiovascular:     Rate and Rhythm: Normal rate and regular rhythm.     Pulses: Normal pulses.     Heart sounds: Normal heart sounds.  Pulmonary:     Effort: Pulmonary effort is normal.     Breath sounds: Normal breath sounds.  Abdominal:     General: There is no distension.     Palpations: Abdomen is soft.     Tenderness: There is no abdominal tenderness.  Musculoskeletal:        General: Normal range of motion.     Cervical back: Normal range of motion and neck supple.  Skin:    General: Skin is warm and dry.     Capillary Refill: Capillary refill takes less than 2 seconds.  Neurological:     General: No focal deficit present.     Mental Status: She is alert and oriented to person, place, and time.  Psychiatric:        Mood and Affect: Mood normal.        Behavior: Behavior normal.    Results for orders placed or performed in visit on 03/22/20 (from the past 24 hour(s))  POCT urinalysis dipstick     Status: Abnormal   Collection Time: 03/22/20  3:00 PM  Result Value Ref Range   Color, UA yellow yellow   Clarity, UA cloudy (A) clear   Glucose, UA negative negative mg/dL   Bilirubin, UA negative negative   Ketones, POC UA negative negative mg/dL   Spec Grav, UA 1.020 1.010 - 1.025   Blood, UA negative negative   pH, UA 8.0 5.0 - 8.0   Protein Ur, POC negative negative mg/dL   Urobilinogen, UA 1.0 0.2 or 1.0 E.U./dL   Nitrite, UA Negative Negative   Leukocytes, UA Negative Negative  POCT Microscopic Urinalysis ([facility])     Status: Abnormal   Collection Time: 03/22/20  3:11 PM  Result Value Ref Range    WBC,UR,HPF,POC None None WBC/hpf   RBC,UR,HPF,POC None None RBC/hpf   Bacteria Moderate (A) None, Too numerous to count   Mucus Absent Absent   Epithelial Cells, UR Per Microscopy  Few (A) None, Too numerous to count cells/hpf   A total of 30 minutes was spent with the patient, greater than 50% of which was in counseling/coordination of care regarding differential diagnosis of pelvic pain, dysuria and UTI, review of all medications including today's antibiotic and side effects, review of most recent blood work including today's urinalysis, review of most recent office visit notes, need for GYN evaluation, prognosis, and need for follow-up.   ASSESSMENT & PLAN: Arlynn was seen today for dysuria and back pain.  Diagnoses and all orders for this visit:  Dysuria -     POCT urinalysis dipstick -     POCT Microscopic Urinalysis ([facility]) -     Urine Culture -     sulfamethoxazole-trimethoprim (BACTRIM DS) 800-160 MG tablet; Take 1 tablet by mouth 2 (two) times daily for 7 days.  Back pain, unspecified back location, unspecified back pain laterality, unspecified chronicity -     POCT urinalysis dipstick -     POCT Microscopic Urinalysis ([facility])  Pelvic pain in female -     Ambulatory referral to Gynecology  Abnormal vaginal bleeding -     Ambulatory referral to Gynecology    Patient Instructions       If you have lab work done today you will be contacted with your lab results within the next 2 weeks.  If you have not heard from [place] then please contact us. The fastest way to get your results is to register for My Chart.   IF you received an x-ray today, you will receive an invoice from [facility]. Please contact [facility] at [phone] with questions or concerns regarding your invoice.   IF you received labwork today, you will receive an invoice from [facility]. Please contact [facility] at [phone] with questions or concerns regarding your invoice.   Our  billing staff will not be able to assist you with questions regarding bills from these companies.  You will be contacted with the lab results as soon as they are available. The fastest way to get your results is to activate your My Chart account. Instructions are located on the last page of this paperwork. If you have not heard from [place] regarding the results in 2 weeks, please contact this office.     Dolor plvico en la mujer Pelvic Pain, Female El dolor plvico se siente en la parte inferior del vientre (abdomen), debajo del ombligo y a nivel de las caderas. El dolor puede comenzar en forma repentina (ser [employer]), reaparecer (ser recurrente) o durar mucho tiempo (volverse crnico). El dolor plvico que dura ms de 6 meses se denomina dolor plvico crnico. El dolor plvico puede tener muchas causas. A veces, la causa del dolor plvico no se conoce. Siga estas indicaciones en su casa:   Tome los medicamentos de venta libre y los recetados solamente como se lo haya indicado el mdico.  Haga reposo como se lo haya indicado el mdico.  No tenga relaciones sexuales si siente dolor.  Lleve un registro del dolor plvico. Escriba los siguientes datos: ? Cundo Energy manager. ? La ubicacin del dolor. ? Qu parece mejorar o empeorar el dolor, como alimentos o el perodo (ciclo menstrual). ? Cualquier sntoma que se presente junto con Conservation officer, historic buildings.  Concurra a todas las visitas de control como se lo haya indicado el mdico. Esto es importante. Comunquese con un mdico si:  Los medicamentos no Forensic psychologist.  El dolor regresa.  Aparecen nuevos sntomas.  Tiene  sangrado o secrecin inusual de la vagina.  Tiene fiebre o escalofros.  Tiene dificultad para defecar (estreimiento).  Observa sangre en el pis (orina) o en la materia fecal (heces).  El pis tiene mal olor.  Se siente dbil o siente que va a desvanecerse. Solicite ayuda inmediatamente si:  Technical brewer repentino y  Wellsite geologist.  El dolor es cada Producer, television/film/video.  Siente un dolor muy intenso y tambin tiene alguno de estos sntomas: ? Cristy Hilts. ? Malestar estomacal (nuseas). ? Vmitos. ? Tiene mucho sudor.  Se desmaya (pierde el conocimiento). Resumen  El dolor plvico se siente en la parte inferior del vientre (abdomen), debajo del ombligo y a nivel de las caderas.  Hay muchas causas posibles del dolor plvico.  Lleve un registro del dolor plvico. Esta informacin no tiene Marine scientist el consejo del mdico. Asegrese de hacerle al mdico cualquier pregunta que tenga. Document Revised: 05/23/2018 Document Reviewed: 05/23/2018 Elsevier Patient Education  2020 Elsevier Inc.      Agustina Caroli, MD [facility]

## 2020-03-22 NOTE — Patient Instructions (Addendum)
     If you have lab work done today you will be contacted with your lab results within the next 2 weeks.  If you have not heard from us then please contact us. The fastest way to get your results is to register for My Chart.   IF you received an x-ray today, you will receive an invoice from [facility]. Please contact [facility] at [phone] with questions or concerns regarding your invoice.   IF you received labwork today, you will receive an invoice from [facility]. Please contact [facility] at [phone] with questions or concerns regarding your invoice.   Our billing staff will not be able to assist you with questions regarding bills from these companies.  You will be contacted with the lab results as soon as they are available. The fastest way to get your results is to activate your My Chart account. Instructions are located on the last page of this paperwork. If you have not heard from us regarding the results in 2 weeks, please contact this office.      Dolor plvico en la mujer Pelvic Pain, Female El dolor plvico se siente en la parte inferior del vientre (abdomen), debajo del ombligo y a nivel de las caderas. El dolor puede comenzar en forma repentina (ser agudo), reaparecer (ser recurrente) o durar mucho tiempo (volverse crnico). El dolor plvico que dura ms de 6 meses se denomina dolor plvico crnico. El dolor plvico puede tener muchas causas. A veces, la causa del dolor plvico no se conoce. Siga estas indicaciones en su casa:   Tome los medicamentos de venta libre y los recetados solamente como se lo haya indicado el mdico.  Haga reposo como se lo haya indicado el mdico.  No tenga relaciones sexuales si siente dolor.  Lleve un registro del dolor plvico. Escriba los siguientes datos: ? Cundo comenz el dolor. ? La ubicacin del dolor. ? Qu parece mejorar o empeorar el dolor, como alimentos o el perodo (ciclo menstrual). ? Cualquier sntoma  que se presente junto con el dolor.  Concurra a todas las visitas de control como se lo haya indicado el mdico. Esto es importante. Comunquese con un mdico si:  Los medicamentos no le alivian el dolor.  El dolor regresa.  Aparecen nuevos sntomas.  Tiene sangrado o secrecin inusual de la vagina.  Tiene fiebre o escalofros.  Tiene dificultad para defecar (estreimiento).  Observa sangre en el pis (orina) o en la materia fecal (heces).  El pis tiene mal olor.  Se siente dbil o siente que va a desvanecerse. Solicite ayuda inmediatamente si:  Siente un dolor repentino y muy intenso.  El dolor es cada vez peor.  Siente un dolor muy intenso y tambin tiene alguno de estos sntomas: ? Fiebre. ? Malestar estomacal (nuseas). ? Vmitos. ? Tiene mucho sudor.  Se desmaya (pierde el conocimiento). Resumen  El dolor plvico se siente en la parte inferior del vientre (abdomen), debajo del ombligo y a nivel de las caderas.  Hay muchas causas posibles del dolor plvico.  Lleve un registro del dolor plvico. Esta informacin no tiene como fin reemplazar el consejo del mdico. Asegrese de hacerle al mdico cualquier pregunta que tenga. Document Revised: 05/23/2018 Document Reviewed: 05/23/2018 Elsevier Patient Education  2020 Elsevier Inc.  

## 2020-03-23 LAB — URINE CULTURE: Organism ID, Bacteria: NO GROWTH

## 2020-03-30 ENCOUNTER — Other Ambulatory Visit: Payer: Self-pay

## 2020-03-31 ENCOUNTER — Ambulatory Visit: Payer: BC Managed Care – PPO | Admitting: Registered"

## 2020-04-02 ENCOUNTER — Ambulatory Visit: Payer: BC Managed Care – PPO | Admitting: Obstetrics and Gynecology

## 2020-04-06 ENCOUNTER — Ambulatory Visit: Payer: BC Managed Care – PPO | Admitting: Obstetrics and Gynecology

## 2020-04-06 ENCOUNTER — Other Ambulatory Visit: Payer: Self-pay

## 2020-04-06 ENCOUNTER — Encounter: Payer: Self-pay | Admitting: Obstetrics and Gynecology

## 2020-04-06 VITALS — BP 126/84 | Ht 61.0 in | Wt 228.0 lb

## 2020-04-06 DIAGNOSIS — N950 Postmenopausal bleeding: Secondary | ICD-10-CM | POA: Diagnosis not present

## 2020-04-06 DIAGNOSIS — R102 Pelvic and perineal pain: Secondary | ICD-10-CM

## 2020-04-06 DIAGNOSIS — N898 Other specified noninflammatory disorders of vagina: Secondary | ICD-10-CM

## 2020-04-06 DIAGNOSIS — B373 Candidiasis of vulva and vagina: Secondary | ICD-10-CM | POA: Diagnosis not present

## 2020-04-06 DIAGNOSIS — B3731 Acute candidiasis of vulva and vagina: Secondary | ICD-10-CM

## 2020-04-06 LAB — WET PREP FOR TRICH, YEAST, CLUE

## 2020-04-06 MED ORDER — NYSTATIN-TRIAMCINOLONE 100000-0.1 UNIT/GM-% EX OINT
1.0000 | TOPICAL_OINTMENT | Freq: Two times a day (BID) | CUTANEOUS | 0 refills | Status: DC
Start: 2020-04-06 — End: 2022-12-28

## 2020-04-06 NOTE — Progress Notes (Signed)
   Alaysha Pint 55/29/1967 [id number]  SUBJECTIVE:  54 y.o. [id number] female patient who presents for evaluation of labial itching and also a postmenopausal bleeding episode last month.  She has had the labial itching on and off for the past 6 years and has been self treating with OTC hydrocortisone cream which only helps sometimes.  Itching is only over the left labia minora.  No shaving.  No vaginal discharge.  She says she had not had a period for about 18 months and then suddenly last month she did have about 3 days of irregular menstrual flow.  Not had any other bleeding since menopause.  She denies any hot flashes or night sweats.  Has not taken any HRT.  She has some pelvic discomfort but she points to the mons pubis area and says that she has some discomfort in that area only when she uses the hydrocortisone cream on the labia.  Normal cytology Pap smear 07/2018.  Professional Spanish interpreter is present for today's visit.   Current Outpatient Medications  Medication Sig Dispense Refill  . ibuprofen (ADVIL,MOTRIN) 800 MG tablet Take 1 tablet (800 mg total) by mouth every 8 (eight) hours as needed. 30 tablet 1  . Multiple Vitamin (MULTIVITAMIN) tablet Take 1 tablet by mouth daily.     No current facility-administered medications for this visit.   Allergies: Patient has no known allergies.  Patient's last menstrual period was 03/25/2018.  Past medical history,surgical history, problem list, medications, allergies, family history and social history were all reviewed and documented as reviewed in the EPIC chart.  ROS:  Feeling well. No dyspnea or chest pain on exertion.  No abdominal pain, change in bowel habits, black or bloody stools.  No urinary tract symptoms. GYN ROS: no abnormal bleeding, pelvic pain or discharge, no breast pain or new or enlarging lumps on self exam. No neurological complaints.   OBJECTIVE:  Ht 5\' 1"  (1.549 m)   Wt 228 lb (103.4 kg)   LMP 03/25/2018   BMI  43.08 kg/m  The patient appears well, alert, oriented x 3, in no distress. PELVIC EXAM: VULVA: normal appearing vulva with no masses, tenderness or lesions, VAGINA: normal appearing vagina with normal color and discharge, no lesions, CERVIX: normal appearing cervix without discharge or lesions, UTERUS & ADNEXA: difficult to palpate but no tenderness, WET MOUNT done - results: hyphae, no Trichomonas or clue cells, few WBC, moderate bacteria, 7-12 epithelial cells/hpf  Chaperone: Caryn Bee present during the examination  ASSESSMENT:  54 y.o. [id number] here with vaginal candidiasis, postmenopausal bleeding episode with occasional pelvic pain  PLAN:  1.  Labial itching probably due to vaginal candidiasis.  Recommend using Mycolog ointment twice daily for 1 week.  She will let us know if there is no improvement. 2.  Postmenopausal bleeding episode.  We discussed recommendation to proceed with pelvic ultrasound to evaluate the endometrial thickness in addition any other abnormalities that might be causing her some occasional pelvic pain such as ovarian cysts.  She does have risk factors for endometrial hyperplasia.  Joseph Pierini MD 04/06/20

## 2020-04-15 ENCOUNTER — Other Ambulatory Visit: Payer: Self-pay

## 2020-04-15 ENCOUNTER — Encounter: Payer: Self-pay | Admitting: Obstetrics and Gynecology

## 2020-04-15 ENCOUNTER — Ambulatory Visit (INDEPENDENT_AMBULATORY_CARE_PROVIDER_SITE_OTHER): Payer: BC Managed Care – PPO

## 2020-04-15 ENCOUNTER — Ambulatory Visit (INDEPENDENT_AMBULATORY_CARE_PROVIDER_SITE_OTHER): Payer: BC Managed Care – PPO | Admitting: Obstetrics and Gynecology

## 2020-04-15 VITALS — BP 124/80

## 2020-04-15 DIAGNOSIS — N8301 Follicular cyst of right ovary: Secondary | ICD-10-CM | POA: Diagnosis not present

## 2020-04-15 DIAGNOSIS — R300 Dysuria: Secondary | ICD-10-CM | POA: Diagnosis not present

## 2020-04-15 DIAGNOSIS — N950 Postmenopausal bleeding: Secondary | ICD-10-CM

## 2020-04-15 DIAGNOSIS — R102 Pelvic and perineal pain: Secondary | ICD-10-CM

## 2020-04-15 LAB — URINALYSIS, COMPLETE W/RFL CULTURE
Bacteria, UA: NONE SEEN /HPF
Bilirubin Urine: NEGATIVE
Glucose, UA: NEGATIVE
Hgb urine dipstick: NEGATIVE
Hyaline Cast: NONE SEEN /LPF
Ketones, ur: NEGATIVE
Leukocyte Esterase: NEGATIVE
Nitrites, Initial: NEGATIVE
Protein, ur: NEGATIVE
RBC / HPF: NONE SEEN /HPF (ref 0–2)
Specific Gravity, Urine: 1.025 (ref 1.001–1.03)
WBC, UA: NONE SEEN /HPF (ref 0–5)
pH: 6.5 (ref 5.0–8.0)

## 2020-04-15 LAB — NO CULTURE INDICATED

## 2020-04-15 NOTE — Progress Notes (Signed)
   Isabel Deleon [birth year] [id number]  SUBJECTIVE:  54 y.o. [id number] female presents for a pelvic ultrasound to evaluate an episode of postmenopausal bleeding last month.  He also has been having some generalized pelvic and abdominal discomfort.  She also was given Mycolog ointment to use for a week with good relief of her vulvar/labial itching symptoms.  She does note some burning and increased urinary frequency.  Professional Spanish interpreter present.  Current Outpatient Medications  Medication Sig Dispense Refill  . ibuprofen (ADVIL,MOTRIN) 800 MG tablet Take 1 tablet (800 mg total) by mouth every 8 (eight) hours as needed. 30 tablet 1  . Multiple Vitamin (MULTIVITAMIN) tablet Take 1 tablet by mouth daily.    Marland Kitchen nystatin-triamcinolone ointment (MYCOLOG) Apply 1 application topically 2 (two) times daily. For 7 days 30 g 0   No current facility-administered medications for this visit.   Allergies: Patient has no known allergies.  Patient's last menstrual period was 03/25/2018.  Past medical history,surgical history, problem list, medications, allergies, family history and social history were all reviewed and documented as reviewed in the EPIC chart.   OBJECTIVE:  BP 124/80   LMP 03/25/2018  The patient appears well, alert, oriented x 3, in no distress. PELVIC EXAM: Deferred as this was performed at last visit  Pelvic ultrasound Uterus 6.6 x 4.5 x 4.7 cm, anteverted Submucosal fibroid 7 mm Symmetrical endometrium 3.26 mm thickness Bilateral atrophic ovaries.  Right ovary with 9 mm follicular cyst. No adnexal masses.  No free fluid.   ASSESSMENT:  54 y.o. [id number] here for ultrasound for evaluation of self-limited episode of postmenopausal bleeding and generalized pelvic/abdominal discomfort  PLAN:  Currently having any pelvic or abdominal discomfort today.  The patient is reassured that the pelvic ultrasound appears normal.  Endometrium is not abnormally thick which  essentially rules out any endometrial pathology.  Tiny degenerating fibroid in the submucosal area not likely to explain any discomfort.  Tiny follicular cyst on right ovary does not need any follow-up as she is just early into menopause.   We will check a urinalysis today. She can follow-up with routine gynecologic care as needed.   Joseph Pierini MD 04/15/20

## 2020-04-28 ENCOUNTER — Ambulatory Visit (INDEPENDENT_AMBULATORY_CARE_PROVIDER_SITE_OTHER): Payer: BC Managed Care – PPO | Admitting: Emergency Medicine

## 2020-04-28 ENCOUNTER — Other Ambulatory Visit: Payer: Self-pay

## 2020-04-28 ENCOUNTER — Encounter: Payer: Self-pay | Admitting: Emergency Medicine

## 2020-04-28 VITALS — BP 110/76 | HR 73 | Temp 98.0°F | Resp 16 | Ht 60.0 in | Wt 225.0 lb

## 2020-04-28 DIAGNOSIS — Z1231 Encounter for screening mammogram for malignant neoplasm of breast: Secondary | ICD-10-CM | POA: Diagnosis not present

## 2020-04-28 DIAGNOSIS — K219 Gastro-esophageal reflux disease without esophagitis: Secondary | ICD-10-CM

## 2020-04-28 MED ORDER — PANTOPRAZOLE SODIUM 40 MG PO TBEC: 40.0000 mg | DELAYED_RELEASE_TABLET | Freq: Every day | ORAL | 3 refills | Status: AC

## 2020-04-28 NOTE — Progress Notes (Signed)
Isabel Deleon 54 y.o.   Chief Complaint  Patient presents with  . Heartburn    per pt for 3 weeks - too much gas in the upper chest    HISTORY OF PRESENT ILLNESS: This is a 54 y.o. female complaining of occasional heartburn for the past [redacted] weeks along with increased belching and gas.  Denies melena or hematemesis.  Denies chest pain or difficulty breathing.   HPI   Prior to Admission medications   Medication Sig Start Date End Date Taking? Authorizing Provider  ibuprofen (ADVIL,MOTRIN) 800 MG tablet Take 1 tablet (800 mg total) by mouth every 8 (eight) hours as needed. 12/28/18  Yes Rutherford Guys, MD  Multiple Vitamin (MULTIVITAMIN) tablet Take 1 tablet by mouth daily.   Yes [provider]  nystatin-triamcinolone ointment (MYCOLOG) Apply 1 application topically 2 (two) times daily. For 7 days 04/06/20   Joseph Pierini, MD    No Known Allergies  Patient Active Problem List   Diagnosis Date Noted  . Follicular cyst of right ovary 04/15/2020  . Pre-diabetes 08/11/2017  . Chronic pain of toes of both feet 01/17/2017    Past Medical History:  Diagnosis Date  . Constipation   . GERD (gastroesophageal reflux disease)    burning in her chest/ throat with diet but never seen dr about this   . Hyperlipidemia    pt denies this finding   . UTI (lower urinary tract infection)     Past Surgical History:  Procedure Laterality Date  . COLONOSCOPY    . POLYPECTOMY    . TUBAL LIGATION      Social History   Socioeconomic History  . Marital status: Married    Spouse name: Not on file  . Number of children: 5  . Years of education: Not on file  . Highest education level: Not on file  Occupational History  . Not on file  Tobacco Use  . Smoking status: Never Smoker  . Smokeless tobacco: Never Used  Vaping Use  . Vaping Use: Never used  Substance and Sexual Activity  . Alcohol use: No  . Drug use: No  . Sexual activity: Yes    Partners: Male    Birth  control/protection: Surgical, Condom    Comment: 1st intercourse 54yo-Fewer than 5 partners  Other Topics Concern  . Not on file  Social History Narrative  . Not on file   Social Determinants of Health   Financial Resource Strain:   . Difficulty of Paying Living Expenses:   Food Insecurity:   . Worried About Charity fundraiser in the Last Year:   . Arboriculturist in the Last Year:   Transportation Needs:   . Film/video editor (Medical):   Marland Kitchen Lack of Transportation (Non-Medical):   Physical Activity:   . Days of Exercise per Week:   . Minutes of Exercise per Session:   Stress:   . Feeling of Stress :   Social Connections:   . Frequency of Communication with Friends and Family:   . Frequency of Social Gatherings with Friends and Family:   . Attends Religious Services:   . Active Member of Clubs or Organizations:   . Attends Archivist Meetings:   Marland Kitchen Marital Status:   Intimate Partner Violence:   . Fear of Current or Ex-Partner:   . Emotionally Abused:   Marland Kitchen Physically Abused:   . Sexually Abused:     Family History  Problem Relation Age of Onset  .  Hypertension Mother   . Stomach cancer Maternal Aunt   . Colon cancer Neg Hx   . Colon polyps Neg Hx   . Esophageal cancer Neg Hx   . Rectal cancer Neg Hx      Review of Systems  Constitutional: Negative.  Negative for chills and fever.  HENT: Negative.  Negative for congestion and sore throat.   Respiratory: Negative.  Negative for cough and shortness of breath.   Cardiovascular: Negative.  Negative for chest pain and palpitations.  Gastrointestinal: Positive for heartburn.  Genitourinary: Negative.  Negative for dysuria and hematuria.  Musculoskeletal: Negative.  Negative for myalgias and neck pain.  Skin: Negative.  Negative for rash.  Neurological: Negative.  Negative for dizziness and headaches.  All other systems reviewed and are negative.  Today's Vitals   04/28/20 0944  BP: 110/76  Pulse: 73    Resp: 16  Temp: 98 F (36.7 C)  TempSrc: Temporal  SpO2: 97%  Weight: 225 lb (102.1 kg)  Height: 5' (1.524 m)   Body mass index is 43.94 kg/m.   Physical Exam Vitals reviewed.  Constitutional:      Appearance: Normal appearance.  HENT:     Head: Normocephalic.     Mouth/Throat:     Mouth: Mucous membranes are moist.     Pharynx: Oropharynx is clear.  Eyes:     Extraocular Movements: Extraocular movements intact.     Conjunctiva/sclera: Conjunctivae normal.     Pupils: Pupils are equal, round, and reactive to light.  Cardiovascular:     Rate and Rhythm: Normal rate and regular rhythm.     Pulses: Normal pulses.     Heart sounds: Normal heart sounds.  Pulmonary:     Effort: Pulmonary effort is normal.     Breath sounds: Normal breath sounds.  Abdominal:     General: There is no distension.     Palpations: Abdomen is soft.     Tenderness: There is no abdominal tenderness.  Musculoskeletal:        General: Normal range of motion.     Cervical back: Normal range of motion and neck supple.  Skin:    General: Skin is warm and dry.     Capillary Refill: Capillary refill takes less than 2 seconds.  Neurological:     General: No focal deficit present.     Mental Status: She is alert and oriented to person, place, and time.  Psychiatric:        Mood and Affect: Mood normal.        Behavior: Behavior normal.    A total of 30 minutes was spent with the patient, greater than 50% of which was in counseling/coordination of care regarding differential diagnosis including GERD,treatment, medications, diet and nutrition, review of most recent OV notes and blood results, prognosis and need for follow up.   ASSESSMENT & PLAN: [name] was seen today for heartburn.  Diagnoses and all orders for this visit:  Gastroesophageal reflux disease without esophagitis -     pantoprazole (PROTONIX) 40 MG tablet; Take 1 tablet (40 mg total) by mouth daily.  Encounter for screening  mammogram for malignant neoplasm of breast -     MM Digital Screening; Future    Patient Instructions       If you have lab work done today you will be contacted with your lab results within the next 2 weeks.  If you have not heard from [place] then please contact us. The fastest way to  get your results is to register for My Chart.   IF you received an x-ray today, you will receive an invoice from [facility]. Please contact [facility] at [phone] with questions or concerns regarding your invoice.   IF you received labwork today, you will receive an invoice from [place]. Please contact [facility] at [phone] with questions or concerns regarding your invoice.   Our billing staff will not be able to assist you with questions regarding bills from these companies.  You will be contacted with the lab results as soon as they are available. The fastest way to get your results is to activate your My Chart account. Instructions are located on the last page of this paperwork. If you have not heard from [place] regarding the results in 2 weeks, please contact this office.     Enfermedad de reflujo gastroesofgico en los adultos Gastroesophageal Reflux Disease, Adult El reflujo gastroesofgico (RGE) ocurre cuando el cido del estmago sube por el tubo que conecta la boca con el estmago (esfago). Normalmente, la comida baja por el esfago y se mantiene en el estmago, donde se la digiere. Cuando una persona tiene RGE, los alimentos y el cido estomacal suelen volver al esfago. Usted puede tener una enfermedad llamada enfermedad de reflujo gastroesofgico (ERGE) si el reflujo:  Sucede a menudo.  Causa sntomas frecuentes o muy intensos.  Causa problemas tales como dao en el esfago. Cuando esto ocurre, el esfago duele y se hincha (inflama). Con el tiempo, la ERGE puede ocasionar pequeos agujeros (lceras) en el revestimiento del esfago. Cules son las causas? Esta  afeccin se debe a un problema en el msculo que se encuentra entre el esfago y [place]. Cuando este msculo est dbil o no es normal, no se cierra correctamente para impedir que los alimentos y el cido regresen del Paramedic. El msculo puede debilitarse debido a lo siguiente:  El consumo de [place].  [place].  Tener cierto tipo de hernia (hernia de hiato).  Consumo de alcohol.  Ciertos alimentos y bebidas, como caf, chocolate, cebollas y [place]. Qu incrementa el riesgo? Es ms probable que tenga esta afeccin si:  Tiene sobrepeso.  Tiene una enfermedad que afecta el tejido conjuntivo.  [place] antiinflamatorios no esteroideos (AINE). Cules son los signos o los sntomas? Los sntomas de esta afeccin incluyen:  Acidez estomacal.  Dificultad o dolor al tragar.  Sensacin de Best boy un bulto en la garganta.  Sabor amargo en la boca.  Mal aliento.  Tener una gran cantidad de saliva.  Estmago inflamado o con Tree surgeon.  Eructos.  Dolor en el pecho. El dolor de pecho puede deberse a distintas afecciones. Asegrese de Teacher, adult education a su mdico si tiene Tourist information centre manager.  Falta de aire o respiracin ruidosa (sibilancias).  Tos constante (crnica) o durante la noche.  Desgaste de la superficie de los dientes (esmalte dental).  Prdida de peso. Cmo se trata? El tratamiento depender de la gravedad de los sntomas. El mdico puede sugerirle lo siguiente:  Cambios en la dieta.  Medicamentos.  Clementeen Hoof. Siga estas indicaciones en su casa: Comida y bebida   Siga una dieta como se lo haya indicado el mdico. Es posible que deba evitar alimentos y bebidas, por ejemplo: ? Caf y t (con o sin cafena). ? Bebidas que contengan alcohol. ? Bebidas energticas y deportivas. ? Bebidas gaseosas y refrescos. ? Chocolate y cacao. ? Menta y [place]. ? Ajo y cebolla. ? Rbano picante. ? Alimentos cidos y condimentados. Estos  incluyen todos los tipos de  pimientos, [place] en polvo, curry en polvo, vinagre, salsas picantes y [employer]. ? Ctricos y sus jugos, por ejemplo, naranjas, limones y limas. ? Alimentos que [employer]. Estos incluyen salsa roja, [place], salsa picante y pizza con salsa de [place]. ? Alimentos fritos y Radio broadcast assistant. Estos incluyen donas, papas fritas, papitas fritas de bolsa y aderezos con alto contenido de [place]. ? Carnes con alto contenido de [place]. Estas incluye los perros calientes, chuletas o costillas, embutidos, jamn y tocino. ? Productos lcteos ricos en grasas, como leche [place], [place] y [place] crema.  Consuma pequeas cantidades de comida con ms frecuencia. Evite consumir porciones abundantes.  Evite beber grandes cantidades de lquidos con las comidas.  Evite comer 2 o 3horas antes de acostarse.  Evite recostarse inmediatamente despus de comer.  No haga ejercicios enseguida despus de comer. Estilo de vida   No consuma ningn producto que contenga nicotina o tabaco. Estos incluyen cigarrillos, cigarrillos electrnicos y tabaco para Higher education careers adviser. Si necesita ayuda para dejar de fumar, consulte al [employer].  Intente reducir [employer] de estrs. Si necesita ayuda para hacer esto, consulte al mdico.  Si tiene sobrepeso, baje una cantidad de peso saludable para usted. Consulte a su mdico para bajar de peso de [employer]. Indicaciones generales  Est atento a cualquier cambio en los sntomas.  Tome los medicamentos de venta libre y los recetados solamente como se lo haya indicado el mdico. No tome aspirina, ibuprofeno ni otros AINE a menos que el mdico lo autorice.  Use ropa holgada. No use nada apretado alrededor de la cintura.  Levante (eleve) la cabecera de la cama aproximadamente 6pulgadas (15cm).  Evite inclinarse si al hacerlo empeoran los sntomas.  Concurra a todas las visitas de seguimiento como se lo haya indicado el mdico. Esto es importante. Comunquese con un mdico si:  Aparecen  nuevos sntomas.  Adelgaza y no sabe por qu.  Tiene problemas para tragar o le duele cuando traga.  Tiene sibilancias o tos persistente.  Los sntomas no mejoran con Dispensing optician.  Tiene la voz ronca. Solicite ayuda inmediatamente si:  [employer], el cuello, la [place], los dientes o la espalda.  Se siente transpirado, mareado o tiene una sensacin de desvanecimiento.  Siente falta de aire o Tourist information centre manager.  Vomita y el vmito tiene un aspecto similar a la sangre o a los posos de caf.  Pierde el conocimiento (se desmaya).  Las deposiciones (heces) son sanguinolentas o negras.  No puede tragar, beber o comer. Resumen  Si una persona tiene enfermedad de reflujo gastroesofgico (ERGE), los alimentos y el cido estomacal suben al esfago y causan sntomas o problemas tales como dao en el esfago.  El tratamiento depender de la gravedad de los sntomas.  Siga una Lexicographer se lo haya indicado el [place] todos los medicamentos solamente como se lo haya indicado el mdico. Esta informacin no tiene Marine scientist el consejo del mdico. Asegrese de hacerle al mdico cualquier pregunta que tenga. Document Revised: 05/16/2018 Document Reviewed: 05/16/2018 Elsevier Patient Education  2020 Elsevier Inc.      Agustina Caroli, MD [facility]

## 2020-04-28 NOTE — Patient Instructions (Addendum)
If you have lab work done today you will be contacted with your lab results within the next 2 weeks.  If you have not heard from [place] then please contact us. The fastest way to get your results is to register for My Chart.   IF you received an x-ray today, you will receive an invoice from [facility]. Please contact [facility] at [phone] with questions or concerns regarding your invoice.   IF you received labwork today, you will receive an invoice from [place]. Please contact [facility] at [phone] with questions or concerns regarding your invoice.   Our billing staff will not be able to assist you with questions regarding bills from these companies.  You will be contacted with the lab results as soon as they are available. The fastest way to get your results is to activate your My Chart account. Instructions are located on the last page of this paperwork. If you have not heard from [place] regarding the results in 2 weeks, please contact this office.     Enfermedad de reflujo gastroesofgico en los adultos Gastroesophageal Reflux Disease, Adult El reflujo gastroesofgico (RGE) ocurre cuando el cido del estmago sube por el tubo que conecta la boca con el estmago (esfago). Normalmente, la comida baja por el esfago y se mantiene en el estmago, donde se la digiere. Cuando una persona tiene RGE, los alimentos y el cido estomacal suelen volver al esfago. Usted puede tener una enfermedad llamada enfermedad de reflujo gastroesofgico (ERGE) si el reflujo:  Sucede a menudo.  Causa sntomas frecuentes o muy intensos.  Causa problemas tales como dao en el esfago. Cuando esto ocurre, el esfago duele y se hincha (inflama). Con el tiempo, la ERGE puede ocasionar pequeos agujeros (lceras) en el revestimiento del esfago. Cules son las causas? Esta afeccin se debe a un problema en el msculo que se encuentra entre el esfago y [place]. Cuando este msculo est  dbil o no es normal, no se cierra correctamente para impedir que los alimentos y el cido regresen del Paramedic. El msculo puede debilitarse debido a lo siguiente:  El consumo de [place].  [place].  Tener cierto tipo de hernia (hernia de hiato).  Consumo de alcohol.  Ciertos alimentos y bebidas, como caf, chocolate, cebollas y [place]. Qu incrementa el riesgo? Es ms probable que tenga esta afeccin si:  Tiene sobrepeso.  Tiene una enfermedad que afecta el tejido conjuntivo.  [place] antiinflamatorios no esteroideos (AINE). Cules son los signos o los sntomas? Los sntomas de esta afeccin incluyen:  Acidez estomacal.  Dificultad o dolor al tragar.  Sensacin de Best boy un bulto en la garganta.  Sabor amargo en la boca.  Mal aliento.  Tener una gran cantidad de saliva.  Estmago inflamado o con Tree surgeon.  Eructos.  Dolor en el pecho. El dolor de pecho puede deberse a distintas afecciones. Asegrese de Teacher, adult education a su mdico si tiene Tourist information centre manager.  Falta de aire o respiracin ruidosa (sibilancias).  Tos constante (crnica) o durante la noche.  Desgaste de la superficie de los dientes (esmalte dental).  Prdida de peso. Cmo se trata? El tratamiento depender de la gravedad de los sntomas. El mdico puede sugerirle lo siguiente:  Cambios en la dieta.  Medicamentos.  Clementeen Hoof. Siga estas indicaciones en su casa: Comida y bebida   Siga una dieta como se lo haya indicado el mdico. Es posible que deba evitar alimentos y bebidas, por ejemplo: ? Caf y t (con o sin  cafena). ? Bebidas que contengan alcohol. ? Bebidas energticas y deportivas. ? Bebidas gaseosas y refrescos. ? Chocolate y cacao. ? Menta y [place]. ? Ajo y cebolla. ? Rbano picante. ? Alimentos cidos y condimentados. Estos incluyen todos los tipos de pimientos, [place] en polvo, curry en polvo, vinagre, salsas picantes y [employer]. ? Ctricos y sus jugos, por ejemplo,  naranjas, limones y limas. ? Alimentos que [employer]. Estos incluyen salsa roja, [place], salsa picante y pizza con salsa de [place]. ? Alimentos fritos y Radio broadcast assistant. Estos incluyen donas, papas fritas, papitas fritas de bolsa y aderezos con alto contenido de [place]. ? Carnes con alto contenido de [place]. Estas incluye los perros calientes, chuletas o costillas, embutidos, jamn y tocino. ? Productos lcteos ricos en grasas, como leche [place], [place] y [place] crema.  Consuma pequeas cantidades de comida con ms frecuencia. Evite consumir porciones abundantes.  Evite beber grandes cantidades de lquidos con las comidas.  Evite comer 2 o 3horas antes de acostarse.  Evite recostarse inmediatamente despus de comer.  No haga ejercicios enseguida despus de comer. Estilo de vida   No consuma ningn producto que contenga nicotina o tabaco. Estos incluyen cigarrillos, cigarrillos electrnicos y tabaco para Higher education careers adviser. Si necesita ayuda para dejar de fumar, consulte al [employer].  Intente reducir [employer] de estrs. Si necesita ayuda para hacer esto, consulte al mdico.  Si tiene sobrepeso, baje una cantidad de peso saludable para usted. Consulte a su mdico para bajar de peso de [employer]. Indicaciones generales  Est atento a cualquier cambio en los sntomas.  Tome los medicamentos de venta libre y los recetados solamente como se lo haya indicado el mdico. No tome aspirina, ibuprofeno ni otros AINE a menos que el mdico lo autorice.  Use ropa holgada. No use nada apretado alrededor de la cintura.  Levante (eleve) la cabecera de la cama aproximadamente 6pulgadas (15cm).  Evite inclinarse si al hacerlo empeoran los sntomas.  Concurra a todas las visitas de seguimiento como se lo haya indicado el mdico. Esto es importante. Comunquese con un mdico si:  Aparecen nuevos sntomas.  Adelgaza y no sabe por qu.  Tiene problemas para tragar o le duele cuando traga.  Tiene sibilancias  o tos persistente.  Los sntomas no mejoran con Dispensing optician.  Tiene la voz ronca. Solicite ayuda inmediatamente si:  [employer], el cuello, la [place], los dientes o la espalda.  Se siente transpirado, mareado o tiene una sensacin de desvanecimiento.  Siente falta de aire o Tourist information centre manager.  Vomita y el vmito tiene un aspecto similar a la sangre o a los posos de caf.  Pierde el conocimiento (se desmaya).  Las deposiciones (heces) son sanguinolentas o negras.  No puede tragar, beber o comer. Resumen  Si una persona tiene enfermedad de reflujo gastroesofgico (ERGE), los alimentos y el cido estomacal suben al esfago y causan sntomas o problemas tales como dao en el esfago.  El tratamiento depender de la gravedad de los sntomas.  Siga una Lexicographer se lo haya indicado el [place] todos los medicamentos solamente como se lo haya indicado el mdico. Esta informacin no tiene Marine scientist el consejo del mdico. Asegrese de hacerle al mdico cualquier pregunta que tenga. Document Revised: 05/16/2018 Document Reviewed: 05/16/2018 Elsevier Patient Education  [place].

## 2020-05-14 ENCOUNTER — Ambulatory Visit: Payer: BC Managed Care – PPO

## 2020-06-11 ENCOUNTER — Ambulatory Visit: Payer: BC Managed Care – PPO

## 2020-07-23 ENCOUNTER — Other Ambulatory Visit: Payer: Self-pay

## 2020-07-23 ENCOUNTER — Encounter: Payer: BC Managed Care – PPO | Attending: Emergency Medicine | Admitting: Dietician

## 2020-07-23 ENCOUNTER — Encounter: Payer: Self-pay | Admitting: Dietician

## 2020-07-23 DIAGNOSIS — R635 Abnormal weight gain: Secondary | ICD-10-CM

## 2020-07-23 NOTE — Progress Notes (Signed)
Medical Nutrition Therapy:  Appt start time: 0930 end time:  1030.   Assessment:  Primary concerns today: weight gain.   Pt speaks only spanish, daughter Howard Pouch is present to interpret. On site interpreter unavailable.  Pt reports her main concern is eating more, and more frequently. States she has always eaten this way, but is getting tired of being overweight. Pt reports having consistent pain in her feet and believes it is because of her weight. Pt describes the pain as dull and sometimes burning sensation on the back of her feet. Pt has past history of prediabetes. Pt reports having acid reflux in general, no specific foods cause symptoms and it does not occur all the time. Pt reports eating large portions at lunch and dinner, but not at breakfast. States that she eats until she is stuffed and uncomfortable, eats quickly. Pt reports wanting to eat less, and wants to stop feeling like she craves food all the time. Pt was prescribed a medication that helped her lose weight about 10 years ago, but stopped taking it because of lack of insurance. Reports she would like to try this medication again. Pt reports eating more proteins than anything else, and very little vegetables. Pt loves peaches and will eat multiple at a time. Pt reports only drinking 16 oz of water a day.   Preferred Learning Style:   Auditory  Visual  Learning Readiness:   Ready   MEDICATIONS: Protonix, Ibuprofen (as needed), Daily MV   DIETARY INTAKE:  Usual eating pattern includes 3 meals and 3 snacks per day.  Everyday foods include Tortilla.  Avoided foods include None reported.    24-hr recall:  B (AM): Banana, 2 cookies  Snk ( AM): None  L ( PM): Salad with carrots and radish, ranch dressing, fried potatoes, 32 oz. sweet tea Snk ( PM): None D ( PM):Green Lentils, water Snk ( PM): none Beverages: Water, Sweet tea  Usual physical activity: Walks about 4 times a week for 30 minutes  Estimated  energy needs: 1800 calories 200 g carbohydrates 135 g protein 50 g fat  Progress Towards Goal(s):  In progress.   Nutritional Diagnosis:  NB-1.1 Food and nutrition-related knowledge deficit As related to Obesity.  As evidenced by BMI of 44.33, and self admission of overconsumption at meals..    Intervention:  Nutrition Education.  Educated patient on the balanced plate eating model. Recommended lunch and dinner be 1/2 non-starchy vegetables, 1/4 starches, and 1/4 protein. Recommended breakfast be a balance of starch and protein with a piece of fruit. Discussed with patient the importance of working towards hitting the proportions of the balanced plate consistently. Used MyPlate placemat to educate patient on ways to recognize each of the food groups from the balanced plate in their own meals, and how close they are to fitting the recommended proportions of the balanced plate. Educated patient on the nutritional value of each food group on the balanced plate model. Counseled patient on beginning to rebuild their trust in themselves to make the right food choices for their health. Educated patient on mindful eating, including listening to their body's hunger and satiety cues, as well as eating slowly and allowing meals to be more of a sensory experience. Educate patient on the importance of proper hydration, and the potential for hunger signals being present when her body really needs water.   Goals:  Discuss appetite suppressant medications with your doctor  Work to making your plate look more like your MyPlate placemat  Lower your proportion of protein at each meal, and work on more non-starchy vegetables.  5 days a week, have ONE meal that fits the proportions of the MyPlate.  Keep on walking 30 minutes at a time for 4 days a week, and try to get in one more day a week! Walk on the weekend! 5 days a week will get you to the goal of 150 minutes per week!  Add a protein to your fruit for a  snack. Try a greek yogurt.  Drink 64 oz/2 liters of water every day   Teaching Method Utilized:  Visual Auditory Hands on  Handouts given during visit include:  MyPlate Placemat Spanish  GERD Nutrition Therapy Nutrition Care Manual, Spanish  Barriers to learning/adherence to lifestyle change: None  Demonstrated degree of understanding via:  Teach Back   Monitoring/Evaluation:  Dietary intake, exercise, new medications, and body weight in 1 month(s).

## 2020-07-23 NOTE — Patient Instructions (Addendum)
Goals:  Discuss appetite suppressant medications with your doctor  Work to making your plate look more like your MyPlate placemat. Lower your proportion of protein at each meal, and work on more non-starchy vegetables. 5 days a week, have ONE meal that fits the proportions of the MyPlate.  Keep on walking 30 minutes at a time for 4 days a week, and try to get in one more day a week! Walk on a weekend! 5 days a week will get you to the goal of 150 minutes per week!  Add a protein to your fruit for a snack. Try a greek yogurt.  Drink 64 oz/2 liters of water every day

## 2020-08-02 ENCOUNTER — Ambulatory Visit: Payer: BC Managed Care – PPO | Admitting: Emergency Medicine

## 2020-08-09 ENCOUNTER — Telehealth: Payer: Self-pay | Admitting: *Deleted

## 2020-08-09 NOTE — Telephone Encounter (Signed)
Left message through interpretor to schedule Mammogram on the Mobile unit 11-16

## 2020-09-07 ENCOUNTER — Ambulatory Visit: Payer: BC Managed Care – PPO | Admitting: Emergency Medicine

## 2020-11-22 ENCOUNTER — Encounter: Payer: Self-pay | Admitting: Emergency Medicine

## 2020-11-22 ENCOUNTER — Telehealth (INDEPENDENT_AMBULATORY_CARE_PROVIDER_SITE_OTHER): Payer: Self-pay | Admitting: Emergency Medicine

## 2020-11-22 ENCOUNTER — Other Ambulatory Visit: Payer: Self-pay

## 2020-11-22 DIAGNOSIS — K219 Gastro-esophageal reflux disease without esophagitis: Secondary | ICD-10-CM

## 2020-11-22 DIAGNOSIS — R7303 Prediabetes: Secondary | ICD-10-CM

## 2020-11-22 DIAGNOSIS — Z1231 Encounter for screening mammogram for malignant neoplasm of breast: Secondary | ICD-10-CM

## 2020-11-22 DIAGNOSIS — K1379 Other lesions of oral mucosa: Secondary | ICD-10-CM

## 2020-11-22 DIAGNOSIS — K0510 Chronic gingivitis, plaque induced: Secondary | ICD-10-CM

## 2020-11-22 MED ORDER — AMOXICILLIN-POT CLAVULANATE 875-125 MG PO TABS: 1.0000 | ORAL_TABLET | Freq: Two times a day (BID) | ORAL | 0 refills | Status: AC

## 2020-11-22 NOTE — Progress Notes (Signed)
Telemedicine Encounter- SOAP NOTE Established Patient Patient: Home  Provider: Office     This telephone encounter was conducted with the patient's (or proxy's) verbal consent via audio telecommunications: yes/no: Yes Patient was instructed to have this encounter in a suitably private space; and to only have persons present to whom they give permission to participate. In addition, patient identity was confirmed by use of name plus two identifiers (DOB and address).  I discussed the limitations, risks, security and privacy concerns of performing an evaluation and management service by telephone and the availability of in person appointments. I also discussed with the patient that there may be a patient responsible charge related to this service. The patient expressed understanding and agreed to proceed.  I spent a total of TIME; 0 MIN TO 60 MIN: 20 minutes talking with the patient or their proxy.  Chief Complaint  Patient presents with  . Dental Pain    Per patient mouth pain for 2 weeks, my husband went to the dentist and he was given a liquid for his mouth. Our toothbrushes were together, I am not sure if they touched.    Subjective   Isabel Deleon is a 54 y.o. female established patient. Telephone visit today complaining of pain to her gums that started 3 weeks ago progressively getting worse.  Concerned that she may have similar infection her husband had recently and was given antibiotics.  Also complaining of sore throat.  No other significant symptoms or concerns.  HPI   Patient Active Problem List   Diagnosis Date Noted  . Pre-diabetes 08/11/2017  . Chronic pain of toes of both feet 01/17/2017    Past Medical History:  Diagnosis Date  . Constipation   . GERD (gastroesophageal reflux disease)    burning in her chest/ throat with diet but never seen dr about this   . Hyperlipidemia    pt denies this finding   . UTI (lower urinary tract infection)     Current  Outpatient Medications  Medication Sig Dispense Refill  . ibuprofen (ADVIL,MOTRIN) 800 MG tablet Take 1 tablet (800 mg total) by mouth every 8 (eight) hours as needed. 30 tablet 1  . Multiple Vitamin (MULTIVITAMIN) tablet Take 1 tablet by mouth daily.    Marland Kitchen nystatin-triamcinolone ointment (MYCOLOG) Apply 1 application topically 2 (two) times daily. For 7 days (Patient not taking: Reported on 11/22/2020) 30 g 0  . pantoprazole (PROTONIX) 40 MG tablet Take 1 tablet (40 mg total) by mouth daily. (Patient not taking: Reported on 11/22/2020) 30 tablet 3   No current facility-administered medications for this visit.    No Known Allergies  Social History   Socioeconomic History  . Marital status: Married    Spouse name: Not on file  . Number of children: 5  . Years of education: Not on file  . Highest education level: Not on file  Occupational History  . Not on file  Tobacco Use  . Smoking status: Never Smoker  . Smokeless tobacco: Never Used  Vaping Use  . Vaping Use: Never used  Substance and Sexual Activity  . Alcohol use: No  . Drug use: No  . Sexual activity: Yes    Partners: Male    Birth control/protection: Surgical, Condom    Comment: 1st intercourse 55yo-Fewer than 5 partners  Other Topics Concern  . Not on file  Social History Narrative  . Not on file   Social Determinants of Health   Financial Resource Strain: Not on  file  Food Insecurity: Not on file  Transportation Needs: Not on file  Physical Activity: Not on file  Stress: Not on file  Social Connections: Not on file  Intimate Partner Violence: Not on file    Review of Systems  Constitutional: Negative.  Negative for chills and fever.  HENT: Negative.  Negative for congestion and sore throat.   Respiratory: Negative.  Negative for cough and shortness of breath.   Cardiovascular: Negative.  Negative for chest pain and palpitations.  Gastrointestinal: Negative for abdominal pain, diarrhea, nausea and vomiting.   Genitourinary: Negative.  Negative for dysuria and hematuria.  Musculoskeletal: Negative.  Negative for myalgias and neck pain.  Skin: Negative.  Negative for rash.  Neurological: Negative for dizziness and headaches.  All other systems reviewed and are negative.   Objective  Alert and oriented x3 in no apparent respiratory distress. Vitals as reported by the patient: There were no vitals filed for this visit. Jola was seen today for dental pain.  Diagnoses and all orders for this visit:  Gingivitis -     amoxicillin-clavulanate (AUGMENTIN) 875-125 MG tablet; Take 1 tablet by mouth 2 (two) times daily for 7 days.  Gastroesophageal reflux disease without esophagitis  Encounter for screening mammogram for malignant neoplasm of breast -     MM Digital Screening; Future  Pre-diabetes  Mouth pain   Clinically stable.  Medication as prescribed. Advised to contact the office and or dentist if no better or worse during the next several days.   I discussed the assessment and treatment plan with the patient. The patient was provided an opportunity to ask questions and all were answered. The patient agreed with the plan and demonstrated an understanding of the instructions.   The patient was advised to call back or seek an in-person evaluation if the symptoms worsen or if the condition fails to improve as anticipated.  I provided 20 minutes of non-face-to-face time during this encounter.  Horald Pollen, MD  Primary Care at [facility]

## 2020-12-04 ENCOUNTER — Encounter (HOSPITAL_COMMUNITY): Payer: Self-pay | Admitting: *Deleted

## 2020-12-04 ENCOUNTER — Emergency Department (HOSPITAL_COMMUNITY)
Admission: EM | Admit: 2020-12-04 | Discharge: 2020-12-04 | Disposition: A | Discharge status: Home / Self Care | Payer: BC Managed Care – PPO | Attending: Emergency Medicine | Admitting: Emergency Medicine

## 2020-12-04 ENCOUNTER — Other Ambulatory Visit: Payer: Self-pay

## 2020-12-04 DIAGNOSIS — R109 Unspecified abdominal pain: Secondary | ICD-10-CM | POA: Insufficient documentation

## 2020-12-04 DIAGNOSIS — R079 Chest pain, unspecified: Secondary | ICD-10-CM | POA: Insufficient documentation

## 2020-12-04 DIAGNOSIS — R519 Headache, unspecified: Secondary | ICD-10-CM | POA: Diagnosis not present

## 2020-12-04 DIAGNOSIS — M545 Low back pain, unspecified: Secondary | ICD-10-CM | POA: Diagnosis not present

## 2020-12-04 DIAGNOSIS — Z5321 Procedure and treatment not carried out due to patient leaving prior to being seen by health care provider: Secondary | ICD-10-CM | POA: Insufficient documentation

## 2020-12-04 DIAGNOSIS — M79601 Pain in right arm: Secondary | ICD-10-CM | POA: Insufficient documentation

## 2020-12-04 DIAGNOSIS — M542 Cervicalgia: Secondary | ICD-10-CM | POA: Diagnosis not present

## 2020-12-04 DIAGNOSIS — Y92410 Unspecified street and highway as the place of occurrence of the external cause: Secondary | ICD-10-CM | POA: Insufficient documentation

## 2020-12-04 LAB — COMPREHENSIVE METABOLIC PANEL
ALT: 40 U/L (ref 0–44)
AST: 28 U/L (ref 15–41)
Albumin: 4.1 g/dL (ref 3.5–5.0)
Alkaline Phosphatase: 57 U/L (ref 38–126)
Anion gap: 10 (ref 5–15)
BUN: 12 mg/dL (ref 6–20)
CO2: 24 mmol/L (ref 22–32)
Calcium: 9.7 mg/dL (ref 8.9–10.3)
Chloride: 106 mmol/L (ref 98–111)
Creatinine, Ser: 0.7 mg/dL (ref 0.44–1.00)
GFR, Estimated: >60 mL/min (ref >60)
Glucose, Bld: 119 mg/dL — ABNORMAL HIGH (ref 70–99)
Potassium: 4 mmol/L (ref 3.5–5.1)
Sodium: 140 mmol/L (ref 135–145)
Total Bilirubin: 0.7 mg/dL (ref 0.3–1.2)
Total Protein: 7.1 g/dL (ref 6.5–8.1)

## 2020-12-04 LAB — URINALYSIS, ROUTINE W REFLEX MICROSCOPIC
Bilirubin Urine: NEGATIVE
Glucose, UA: NEGATIVE mg/dL
Hgb urine dipstick: NEGATIVE
Ketones, ur: NEGATIVE mg/dL
Leukocytes,Ua: NEGATIVE
Nitrite: NEGATIVE
Protein, ur: NEGATIVE mg/dL
Specific Gravity, Urine: 1.01 (ref 1.005–1.030)
pH: 6 (ref 5.0–8.0)

## 2020-12-04 LAB — I-STAT BETA HCG BLOOD, ED (MC, WL, AP ONLY): I-stat hCG, quantitative: <5 m[IU]/mL (ref <5)

## 2020-12-04 LAB — LIPASE, BLOOD: Lipase: 35 U/L (ref 11–51)

## 2020-12-04 LAB — CBC
HCT: 42.5 % (ref 36.0–46.0)
Hemoglobin: 13.9 g/dL (ref 12.0–15.0)
MCH: 29.4 pg (ref 26.0–34.0)
MCHC: 32.7 g/dL (ref 30.0–36.0)
MCV: 90 fL (ref 80.0–100.0)
Platelets: 303 10*3/uL (ref 150–400)
RBC: 4.72 MIL/uL (ref 3.87–5.11)
RDW: 14 % (ref 11.5–15.5)
WBC: 7.6 10*3/uL (ref 4.0–10.5)
nRBC: 0 % (ref 0.0–0.2)
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
## 2020-12-04 NOTE — ED Notes (Signed)
Pt up to desk asking about wait times. Pt informed of longest wait. Pt encouraged to stay, but eventually decided to leave.
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
## 2021-03-25 ENCOUNTER — Ambulatory Visit (HOSPITAL_COMMUNITY)
Admission: EM | Admit: 2021-03-25 | Discharge: 2021-03-25 | Disposition: A | Discharge status: Home / Self Care | Payer: BC Managed Care – PPO | Attending: Urgent Care | Admitting: Urgent Care

## 2021-03-25 ENCOUNTER — Encounter (HOSPITAL_COMMUNITY): Payer: Self-pay | Admitting: Urgent Care

## 2021-03-25 ENCOUNTER — Other Ambulatory Visit: Payer: Self-pay

## 2021-03-25 DIAGNOSIS — R300 Dysuria: Secondary | ICD-10-CM | POA: Insufficient documentation

## 2021-03-25 DIAGNOSIS — R350 Frequency of micturition: Secondary | ICD-10-CM | POA: Insufficient documentation

## 2021-03-25 DIAGNOSIS — R1030 Lower abdominal pain, unspecified: Secondary | ICD-10-CM | POA: Diagnosis not present

## 2021-03-25 LAB — POCT URINALYSIS DIPSTICK, ED / UC
Bilirubin Urine: NEGATIVE
Glucose, UA: NEGATIVE mg/dL
Hgb urine dipstick: NEGATIVE
Ketones, ur: NEGATIVE mg/dL
Leukocytes,Ua: NEGATIVE
Nitrite: NEGATIVE
Protein, ur: NEGATIVE mg/dL
Specific Gravity, Urine: 1.02 (ref 1.005–1.030)
Urobilinogen, UA: 1 mg/dL (ref 0.0–1.0)
pH: 7 (ref 5.0–8.0)

## 2021-03-25 MED ORDER — PHENAZOPYRIDINE HCL 95 MG PO TABS: 95.0000 mg | ORAL_TABLET | Freq: Three times a day (TID) | ORAL | 0 refills | Status: DC | PRN

## 2021-03-25 NOTE — ED Provider Notes (Signed)
[name]   MRN: [id number] DOB: 11-05-1965  Subjective:   Isabel Deleon is a 55 y.o. female presenting for 1 month history of persistent and recurrent dysuria, urinary frequency and urinary urgency.  At times it causes lower abdominal pain and low back pain.  Denies fever, nausea, vomiting, hematuria, vaginal discharge, genital rashes.  Patient states that she has had frequent bouts of difficulty with this.  States that she has never had a urine culture come back positive with a bacterial infection.  She does have some difficulty with constipation which can worsen her urinary symptoms.  She has a gynecologist that she sees regularly but has not cover this with her.  Would like to see her urologist.  Lubertha Basque that she drinks fruit juices and also tries to hydrate with water.  This past week she has been doing a lot of fruit juice.  No alcohol, sweet tea, caffeine, sodas.  Patient is sexually active, consistently uses condoms for protection with just one female partner.  No current facility-administered medications for this encounter.  Current Outpatient Medications:    ibuprofen (ADVIL,MOTRIN) 800 MG tablet, Take 1 tablet (800 mg total) by mouth every 8 (eight) hours as needed., Disp: 30 tablet, Rfl: 1   Multiple Vitamin (MULTIVITAMIN) tablet, Take 1 tablet by mouth daily., Disp: , Rfl:    nystatin-triamcinolone ointment (MYCOLOG), Apply 1 application topically 2 (two) times daily. For 7 days (Patient not taking: No sig reported), Disp: 30 g, Rfl: 0   pantoprazole (PROTONIX) 40 MG tablet, Take 1 tablet (40 mg total) by mouth daily. (Patient not taking: No sig reported), Disp: 30 tablet, Rfl: 3   No Known Allergies  Past Medical History:  Diagnosis Date   Constipation    GERD (gastroesophageal reflux disease)    burning in her chest/ throat with diet but never seen dr about this    Hyperlipidemia    pt denies this finding    UTI (lower urinary tract infection)       Past Surgical History:  Procedure Laterality Date   COLONOSCOPY     POLYPECTOMY     TUBAL LIGATION      Family History  Problem Relation Age of Onset   Hypertension Mother    Stomach cancer Maternal Aunt    Colon cancer Neg Hx    Colon polyps Neg Hx    Esophageal cancer Neg Hx    Rectal cancer Neg Hx     Social History   Tobacco Use   Smoking status: Never   Smokeless tobacco: Never  Vaping Use   Vaping Use: Never used  Substance Use Topics   Alcohol use: No   Drug use: No    ROS   Objective:   Vitals: BP (!) 144/90 (BP Location: Left Arm)   Pulse 67   Temp 98 F (36.7 C) (Oral)   Resp 18   LMP 03/25/2018   SpO2 95%   Physical Exam Constitutional:      General: She is not in acute distress.    Appearance: Normal appearance. She is well-developed and normal weight. She is not ill-appearing, toxic-appearing or diaphoretic.  HENT:     Head: Normocephalic and atraumatic.     Right Ear: External ear normal.     Left Ear: External ear normal.     Nose: Nose normal.     Mouth/Throat:     Mouth: Mucous membranes are moist.     Pharynx: Oropharynx is clear.  Eyes:  General: No scleral icterus.    Extraocular Movements: Extraocular movements intact.     Pupils: Pupils are equal, round, and reactive to light.  Cardiovascular:     Rate and Rhythm: Normal rate and regular rhythm.     Heart sounds: Normal heart sounds. No murmur heard.   No friction rub. No gallop.  Pulmonary:     Effort: Pulmonary effort is normal. No respiratory distress.     Breath sounds: Normal breath sounds. No stridor. No wheezing, rhonchi or rales.  Abdominal:     General: Bowel sounds are normal. There is no distension.     Palpations: Abdomen is soft. There is no mass.     Tenderness: There is no abdominal tenderness. There is no right CVA tenderness, left CVA tenderness, guarding or rebound.  Skin:    General: Skin is warm and dry.     Coloration: Skin is not pale.      Findings: No rash.  Neurological:     General: No focal deficit present.     Mental Status: She is alert and oriented to person, place, and time.  Psychiatric:        Mood and Affect: Mood normal.        Behavior: Behavior normal.        Thought Content: Thought content normal.        Judgment: Judgment normal.    Results for orders placed or performed during the hospital encounter of 03/25/21 (from the past 24 hour(s))  POC Urinalysis dipstick     Status: None   Collection Time: 03/25/21  3:00 PM  Result Value Ref Range   Glucose, UA NEGATIVE NEGATIVE mg/dL   Bilirubin Urine NEGATIVE NEGATIVE   Ketones, ur NEGATIVE NEGATIVE mg/dL   Specific Gravity, Urine 1.020 1.005 - 1.030   Hgb urine dipstick NEGATIVE NEGATIVE   pH 7.0 5.0 - 8.0   Protein, ur NEGATIVE NEGATIVE mg/dL   Urobilinogen, UA 1.0 0.0 - 1.0 mg/dL   Nitrite NEGATIVE NEGATIVE   Leukocytes,Ua NEGATIVE NEGATIVE    Assessment and Plan :   PDMP not reviewed this encounter.  1. Dysuria   2. Urinary frequency   3. Lower abdominal pain     Suspect interstitial cystitis as this is a diagnosis of exclusion, recommended follow-up with her gynecologist and also provided her with information to see a urologist for complete work-up.  Urinalysis is equivocal.  Recommended hydrating with just plain water.  Use Pyridium for symptomatic relief.  STI testing pending.  Urine culture pending. Counseled patient on potential for adverse effects with medications prescribed/recommended today, ER and return-to-clinic precautions discussed, patient verbalized understanding.    Jaynee Eagles, PA-C 03/25/21 1551

## 2021-03-25 NOTE — ED Triage Notes (Signed)
Pt reports lower back pain, lower abdominal pain and increased urinary frequency x 1 month.

## 2021-03-28 LAB — CERVICOVAGINAL ANCILLARY ONLY
Bacterial Vaginitis (gardnerella): NEGATIVE
Candida Glabrata: NEGATIVE
Candida Vaginitis: NEGATIVE
Chlamydia: NEGATIVE
Comment: NEGATIVE
Comment: NEGATIVE
Comment: NEGATIVE
Comment: NEGATIVE
Comment: NEGATIVE
Comment: NORMAL
Neisseria Gonorrhea: NEGATIVE
Trichomonas: NEGATIVE

## 2022-04-07 ENCOUNTER — Other Ambulatory Visit: Payer: Self-pay | Admitting: *Deleted

## 2022-04-07 DIAGNOSIS — I8393 Asymptomatic varicose veins of bilateral lower extremities: Secondary | ICD-10-CM

## 2022-04-20 NOTE — Progress Notes (Signed)
Office Note     CC: Left leg pain Requesting Provider:  Horald Pollen, *  HPI: Isabel Deleon is a 56 y.o. ([birth year]) female who presents at Isabel request of Sagardia, Ines Bloomer, MD for evaluation of left leg pain.  Patient has previous history of left calf venous ablation versus stripping 6 years ago [place] vein.  Since that time, she has had significant weight gain and appreciated varicosities in Isabel left leg.  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  She appreciates pain on Isabel medial aspect of her left calf that radiates down to her foot and into Isabel arch.  This waxes and wanes, improves with certain footwear.  Is milder uses compression stockings intermittently.  When she does not wear them, she is worried one of Isabel varicosities may rupture.  She has never had an ulceration or bleeding event, denies burning or throbbing at Isabel sites.  She has no history of DVT  Past Medical History:  Diagnosis Date   Constipation    GERD (gastroesophageal reflux disease)    burning in her chest/ throat with diet but never seen dr about this    Hyperlipidemia    pt denies this finding    UTI (lower urinary tract infection)     Past Surgical History:  Procedure Laterality Date   COLONOSCOPY     POLYPECTOMY     TUBAL LIGATION      Social History   Socioeconomic History   Marital status: Married    Spouse name: Not on file   Number of children: 5   Years of education: Not on file   Highest education level: Not on file  Occupational History   Not on file  Tobacco Use   Smoking status: Never   Smokeless tobacco: Never  Vaping Use   Vaping Use: Never used  Substance and Sexual Activity   Alcohol use: No   Drug use: No   Sexual activity: Yes    Partners: Male    Birth control/protection: Surgical, Condom    Comment: 1st intercourse 56yo-Fewer than 5 partners  Other Topics Concern   Not on file  Social History Narrative    Not on file   Social Determinants of Health   Financial Resource Strain: Not on file  Food Insecurity: Not on file  Transportation Needs: Not on file  Physical Activity: Not on file  Stress: Not on file  Social Connections: Not on file  Intimate Partner Violence: Not on file    Family History  Problem Relation Age of Onset   Hypertension Mother    Stomach cancer Maternal Aunt    Colon cancer Neg Hx    Colon polyps Neg Hx    Esophageal cancer Neg Hx    Rectal cancer Neg Hx     Current Outpatient Medications  Medication Sig Dispense Refill   ibuprofen (ADVIL,MOTRIN) 800 MG tablet Take 1 tablet (800 mg total) by mouth every 8 (eight) hours as needed. 30 tablet 1   Multiple Vitamin (MULTIVITAMIN) tablet Take 1 tablet by mouth daily.     nystatin-triamcinolone ointment (MYCOLOG) Apply 1 application topically 2 (two) times daily. For 7 days (Patient not taking: No sig reported) 30 g 0   pantoprazole (PROTONIX) 40 MG tablet Take 1 tablet (40 mg total) by mouth daily. (Patient not taking: No sig reported) 30 tablet 3   phenazopyridine (PYRIDIUM) 95 MG tablet Take 1 tablet (95 mg total) by mouth 3 (  three) times daily as needed for pain. 30 tablet 0   No current facility-administered medications for this visit.    No Known Allergies   REVIEW OF SYSTEMS:  '[X]'$  denotes positive finding, '[ ]'$  denotes negative finding Cardiac  Comments:  Chest pain or chest pressure:    Shortness of breath upon exertion:    Short of breath when lying flat:    Irregular heart rhythm:        Vascular    Pain in calf, thigh, or hip brought on by ambulation:    Pain in feet at night that wakes you up from your sleep:     Blood clot in your veins:    Leg swelling:         Pulmonary    Oxygen at home:    Productive cough:     Wheezing:         Neurologic    Sudden weakness in arms or legs:     Sudden numbness in arms or legs:     Sudden onset of difficulty speaking or slurred speech:     Temporary loss of vision in one eye:     Problems with dizziness:         Gastrointestinal    Blood in stool:     Vomited blood:         Genitourinary    Burning when urinating:     Blood in urine:        Psychiatric    Major depression:         Hematologic    Bleeding problems:    Problems with blood clotting too easily:        Skin    Rashes or ulcers:        Constitutional    Fever or chills:      PHYSICAL EXAMINATION:  There were no vitals filed for this visit.  General:  WDWN in NAD; vital signs documented above Gait: Not observed HENT: WNL, normocephalic Pulmonary: normal non-labored breathing , without Rales, rhonchi,  wheezing Cardiac: regular HR,  Abdomen: soft, NT, no masses Skin: without rashes Vascular Exam/Pulses:  Right Left  Radial 2+ (normal) 2+ (normal)  Ulnar 2+ (normal) 2+ (normal)  Femoral    Popliteal    DP 2+ (normal) 2+ (normal)  PT 2+ (normal) 2+ (normal)   Extremities: without ischemic changes, without Gangrene , without cellulitis; without open wounds;  Musculoskeletal: no muscle wasting or atrophy  Neurologic: A&O X 3;  No focal weakness or paresthesias are detected Psychiatric:  Isabel pt has Normal affect.   Non-Invasive Vascular Imaging:   +--------------+---------+------+-----------+------------+-------------+  LEFT          Reflux NoRefluxReflux TimeDiameter cmsComments                               Yes                                        +--------------+---------+------+-----------+------------+-------------+  CFV           no                                                   +--------------+---------+------+-----------+------------+-------------+  FV mid        no                                                   +--------------+---------+------+-----------+------------+-------------+  Popliteal     no                                                    +--------------+---------+------+-----------+------------+-------------+  GSV at SFJ              yes    >500 ms      0.42                   +--------------+---------+------+-----------+------------+-------------+  GSV prox thighno                            0.53                   +--------------+---------+------+-----------+------------+-------------+  GSV mid thigh no               >500 ms      0.42    out of fascia  +--------------+---------+------+-----------+------------+-------------+  GSV dist thighno                            0.23    out of fascia  +--------------+---------+------+-----------+------------+-------------+  GSV at knee                                         NV             +--------------+---------+------+-----------+------------+-------------+  GSV prox calf                                       NV             +--------------+---------+------+-----------+------------+-------------+  GSV mid calf                                        NV             +--------------+---------+------+-----------+------------+-------------+  SSV Pop Fossa no                            0.24                   +--------------+---------+------+-----------+------------+-------------+  SSV prox calf no                            0.19                   +--------------+---------+------+-----------+------------+-------------+  SSV mid calf  no  0.33                   +--------------+---------+------+-----------+------------+-------------+          Summary:  Left:  - No evidence of deep vein thrombosis seen in Isabel left lower extremity,  from Isabel common femoral through Isabel popliteal veins.  - No evidence of superficial venous thrombosis in Isabel left lower  extremity.  - No evidence of superficial venous reflux seen in Isabel left short  saphenous vein.  - Venous reflux is noted in Isabel left  sapheno-femoral junction.  - Venous reflux is noted in Isabel left greater saphenous vein in Isabel thigh.     *See table(s) above for measurements and observations.    ASSESSMENT/PLAN:: 56 y.o. female presenting with a 6-year history of waxing and waning pain appreciated on Isabel medial aspect of her left calf radiating into Isabel foot.  This pain has worsened with recent weight gain.  Isabel majority of her plain sits in her plantar arch, but she is also concerned about a varicosity present on Isabel medial aspect of Isabel calf.  Venous duplex ultrasonography was reviewed demonstrating greater saphenous vein ablation versus excision at Isabel left calf.  There are 2 areas of focal venous insufficiency in Isabel thigh, otherwise veins are normal.  I had a long conversation with Isabel Deleon regarding Isabel above.  Venous insufficiency and varicose veins can be a product of weight gain.  With a BMI of 44, I think that this is a contributing factor.  Should she have an ulceration or bleeding event, we could discuss stab phlebectomy, however I do not think she would see significant benefit from venous ablation of Isabel greater saphenous vein in Isabel thigh.  At this time, I think she would benefit most from weight loss, continued compression stockings as treatment for her varicose veins.  She was fitted for thigh-high compression stockings in clinic today.  Regarding pain on Isabel inner calf, I do not know why this pain has been present for 6 years.  Isabel saphenous vein runs with Isabel saphenous nerve which can be injured at Isabel time of surgery.  Being that Isabel pain is also in Isabel plantar arch I do have some concern for musculoskeletal issues.  She may benefit from Ortho foot and ankle consultation.  Isabel Deleon and I had a long conversation regarding Isabel above.  I am happy to see her as needed should any questions or concerns arise.  I asked that she call my office immediately should bleeding or ulceration of Isabel medial varicosity occur.      Broadus John, MD [facility] [phone]

## 2022-04-21 ENCOUNTER — Encounter: Payer: Self-pay | Admitting: Vascular Surgery

## 2022-04-21 ENCOUNTER — Ambulatory Visit: Payer: BC Managed Care – PPO | Admitting: Vascular Surgery

## 2022-04-21 ENCOUNTER — Ambulatory Visit (HOSPITAL_COMMUNITY)
Admission: RE | Admit: 2022-04-21 | Discharge: 2022-04-21 | Disposition: A | Discharge status: Home / Self Care | Payer: BC Managed Care – PPO | Source: Ambulatory Visit | Attending: Vascular Surgery | Admitting: Vascular Surgery

## 2022-04-21 VITALS — BP 144/91 | HR 77 | Temp 98.1°F | Resp 20 | Ht 60.0 in | Wt 223.0 lb

## 2022-04-21 DIAGNOSIS — I83893 Varicose veins of bilateral lower extremities with other complications: Secondary | ICD-10-CM

## 2022-04-21 DIAGNOSIS — I8393 Asymptomatic varicose veins of bilateral lower extremities: Secondary | ICD-10-CM | POA: Diagnosis not present

## 2022-12-15 ENCOUNTER — Encounter: Payer: Self-pay | Admitting: Nurse Practitioner

## 2022-12-26 NOTE — Telephone Encounter (Signed)
Schedule patient

## 2022-12-28 ENCOUNTER — Ambulatory Visit (INDEPENDENT_AMBULATORY_CARE_PROVIDER_SITE_OTHER): Payer: BC Managed Care – PPO | Admitting: Nurse Practitioner

## 2022-12-28 ENCOUNTER — Other Ambulatory Visit (HOSPITAL_COMMUNITY)
Admission: RE | Admit: 2022-12-28 | Discharge: 2022-12-28 | Disposition: A | Discharge status: Home / Self Care | Payer: BC Managed Care – PPO | Source: Ambulatory Visit | Attending: Nurse Practitioner | Admitting: Nurse Practitioner

## 2022-12-28 ENCOUNTER — Encounter: Payer: Self-pay | Admitting: Nurse Practitioner

## 2022-12-28 VITALS — BP 124/82 | HR 90 | Ht 60.0 in | Wt 225.0 lb

## 2022-12-28 DIAGNOSIS — B3731 Acute candidiasis of vulva and vagina: Secondary | ICD-10-CM | POA: Diagnosis not present

## 2022-12-28 DIAGNOSIS — Z780 Asymptomatic menopausal state: Secondary | ICD-10-CM

## 2022-12-28 DIAGNOSIS — Z124 Encounter for screening for malignant neoplasm of cervix: Secondary | ICD-10-CM

## 2022-12-28 DIAGNOSIS — N9089 Other specified noninflammatory disorders of vulva and perineum: Secondary | ICD-10-CM

## 2022-12-28 DIAGNOSIS — Z01419 Encounter for gynecological examination (general) (routine) without abnormal findings: Secondary | ICD-10-CM

## 2022-12-28 LAB — WET PREP FOR TRICH, YEAST, CLUE

## 2022-12-28 MED ORDER — FLUCONAZOLE 150 MG PO TABS: 150.0000 mg | ORAL_TABLET | ORAL | 0 refills | Status: AC

## 2022-12-28 NOTE — Progress Notes (Signed)
Isabel Deleon June 17, 1966 [id number]   History:  57 y.o. [id number] presents for annual exam. Postmenopausal - no HRT, no bleeding. 2017/2018 pap normal cytology + HR HPV, 2018 biopsy showed atypia, normal pap neg HPV in 2019. Interpreter present during visit.   Gynecologic History Patient's last menstrual period was 03/25/2018.   Contraception/Family planning: post menopausal status and tubal ligation Sexually active: Yes  Health Maintenance Last Pap: 08/10/2018. Results were: Normal neg HPV Last mammogram: 2023. Results were: Normal Last colonoscopy: 01/05/2020. Results were: Tubular adenoma, 3-year recall Last Dexa: Not indicated  Past medical history, past surgical history, family history and social history were all reviewed and documented in the EPIC chart. Married. Cleaner. 5 children, 4 grandchildren and one on the way.   ROS:  A ROS was performed and pertinent positives and negatives are included.  Exam:  Vitals:   12/28/22 1008  BP: 124/82  Pulse: 90  SpO2: 96%  Weight: 225 lb (102.1 kg)  Height: 5' (1.524 m)   Body mass index is 43.94 kg/m.  General appearance:  Normal Thyroid:  Symmetrical, normal in size, without palpable masses or nodularity. Respiratory  Auscultation:  Clear without wheezing or rhonchi Cardiovascular  Auscultation:  Regular rate, without rubs, murmurs or gallops  Edema/varicosities:  Not grossly evident Abdominal  Soft,nontender, without masses, guarding or rebound.  Liver/spleen:  No organomegaly noted  Hernia:  None appreciated  Skin  Inspection:  Grossly normal Breasts: Examined lying and sitting.   Right: Without masses, retractions, nipple discharge or axillary adenopathy.   Left: Without masses, retractions, nipple discharge or axillary adenopathy. Genitourinary   Inguinal/mons:  Normal without inguinal adenopathy  External genitalia:  Redness, excoriation on labia majora  BUS/Urethra/Skene's glands:  Normal  Vagina:   Normal appearing with normal color and discharge, no lesions  Cervix:  Normal appearing without discharge or lesions  Uterus:  Normal in size, shape and contour.  Midline and mobile, nontender  Adnexa/parametria:     Rt: Normal in size, without masses or tenderness.   Lt: Normal in size, without masses or tenderness.  Anus and perineum: Normal  Digital rectal exam: Deferred  Patient informed chaperone available to be present for breast and pelvic exam. Patient has requested no chaperone to be present. Patient has been advised what will be completed during breast and pelvic exam.   Wet prep + yeast  Assessment/Plan:  57 y.o. [id number] for annual exam.   Well female exam with routine gynecological exam - Education provided on SBEs, importance of preventative screenings, current guidelines, high calcium diet, regular exercise, and multivitamin daily. Labs with PCP.   Postmenopausal - no HRT, no bleeding.   Screening for cervical cancer - Plan: Cytology - PAP( [place]). 2017/2018 pap normal cytology + HR HPV, 2018 biopsy showed atypia, normal pap neg HPV in 2019.  Vulvar irritation - Plan: WET PREP FOR [place], YEAST, CLUE. Redness seen on exam. Reports intermittent itching. Denies discharge or odor. + yeast.  Vulvovaginal candidiasis - Plan: fluconazole (DIFLUCAN) 150 MG tablet today and repeat in 3 days for total of 2 doses.   Screening for breast cancer - Normal mammogram history.  Continue annual screenings.  Normal breast exam today.  Screening for colon cancer - 12/2019 colonoscopy.  Due now and encouraged to call to get this scheduled. Information provided on [facility].   Screening for osteoporosis - Average risk. Will plan DXA at age 57.   Return in 1 year for annual.     Tilmon Wisehart  Armstead Peaks DNP, 10:39 AM 12/28/2022

## 2023-01-01 LAB — CYTOLOGY - PAP
Comment: NEGATIVE
Diagnosis: NEGATIVE
High risk HPV: NEGATIVE

## 2023-02-14 ENCOUNTER — Encounter: Payer: Self-pay | Admitting: Gastroenterology

## 2024-01-04 ENCOUNTER — Encounter: Payer: Self-pay | Admitting: Gastroenterology

## 2024-03-11 ENCOUNTER — Ambulatory Visit (AMBULATORY_SURGERY_CENTER): Payer: Self-pay | Admitting: *Deleted

## 2024-03-11 ENCOUNTER — Encounter: Payer: Self-pay | Admitting: Gastroenterology

## 2024-03-11 VITALS — Ht 60.0 in | Wt 211.0 lb

## 2024-03-11 DIAGNOSIS — Z860100 Personal history of colon polyps, unspecified: Secondary | ICD-10-CM

## 2024-03-11 MED ORDER — NA SULFATE-K SULFATE-MG SULF 17.5-3.13-1.6 GM/177ML PO SOLN: 1.0000 | Freq: Once | ORAL | 0 refills | Status: DC

## 2024-03-11 MED ORDER — NA SULFATE-K SULFATE-MG SULF 17.5-3.13-1.6 GM/177ML PO SOLN
1.0000 | Freq: Once | ORAL | 0 refills | Status: AC
Start: 2024-03-11 — End: 2024-03-11

## 2024-03-11 NOTE — Progress Notes (Signed)
 Pt's name and DOB verified at the beginning of the pre-visit wit 2 identifiers  Permission given to speak with interpretor present  Pt denies any difficulty with ambulating,sitting, laying down or rolling side to side  Pt has no issues moving head neck or swallowing  No egg or soy allergy known to patient   No issues known to pt with past sedation with any surgeries or procedures  Patient denies ever being intubated  No FH of Malignant Hyperthermia  Pt is not on home 02   Pt is not on blood thinners   Pt has frequent issues with constipation RN instructed pt to use Miralax  per bottles instructions a week before prep days. Pt states they will  Pt is not on dialysis  Pt denise any abnormal heart rhythms   Pt denies any upcoming cardiac testing  Patient's chart reviewed by Rogena Class CNRA prior to pre-visit and patient appropriate for the [facility].  Pre-visit completed and red dot placed by patient's name on their procedure day (on provider's schedule).    Visit in person with Spanish interpretor   Pt states weight is 211 lb  IInstructions reviewed. Pt given  both [facility] main # and MD on call # prior to instructions.  Pt states understanding of instructions. Instructed pt to review instructions again prior to procedure and call main # given if has questions.. Pt states they will.

## 2024-03-31 ENCOUNTER — Encounter: Payer: Self-pay | Admitting: Gastroenterology

## 2024-03-31 ENCOUNTER — Ambulatory Visit (AMBULATORY_SURGERY_CENTER): Payer: Self-pay | Admitting: Gastroenterology

## 2024-03-31 VITALS — BP 126/63 | HR 66 | Temp 98.4°F | Resp 16 | Ht 60.0 in | Wt 211.0 lb

## 2024-03-31 DIAGNOSIS — K5730 Diverticulosis of large intestine without perforation or abscess without bleeding: Secondary | ICD-10-CM | POA: Diagnosis not present

## 2024-03-31 DIAGNOSIS — K648 Other hemorrhoids: Secondary | ICD-10-CM

## 2024-03-31 DIAGNOSIS — Z1211 Encounter for screening for malignant neoplasm of colon: Secondary | ICD-10-CM | POA: Diagnosis present

## 2024-03-31 DIAGNOSIS — K644 Residual hemorrhoidal skin tags: Secondary | ICD-10-CM

## 2024-03-31 DIAGNOSIS — D123 Benign neoplasm of transverse colon: Secondary | ICD-10-CM

## 2024-03-31 DIAGNOSIS — Z860100 Personal history of colon polyps, unspecified: Secondary | ICD-10-CM

## 2024-03-31 MED ORDER — SODIUM CHLORIDE 0.9 % IV SOLN: 500.0000 mL | Freq: Once | INTRAVENOUS | Status: DC

## 2024-03-31 NOTE — Progress Notes (Unsigned)
 [facility] History and Physical   Primary Care Physician:  Elvira Hammersmith, MD   Reason for Procedure:  History of adenomatous colon polyps  Plan:    Surveillance colonoscopy with possible interventions as needed     HPI: Isabel Deleon is a very pleasant 58 y.o. female here for surveillance colonoscopy. Denies any nausea, vomiting, abdominal pain, melena or bright red blood per rectum  The risks and benefits as well as alternatives of endoscopic procedure(s) have been discussed and reviewed. All questions answered. The patient agrees to proceed.    Past Medical History:  Diagnosis Date   Constipation    GERD (gastroesophageal reflux disease)    burning in her chest/ throat with diet but never seen dr about this    Hyperlipidemia    pt denies this finding    UTI (lower urinary tract infection)     Past Surgical History:  Procedure Laterality Date   COLONOSCOPY     POLYPECTOMY     TUBAL LIGATION     TUBAL LIGATION  2008    Prior to Admission medications   Medication Sig Start Date End Date Taking? Authorizing Provider  Acetaminophen (TYLENOL PO) Take by mouth.   Yes [provider]  Multiple Vitamin (MULTIVITAMIN) tablet Take 1 tablet by mouth daily.   Yes [provider]  pantoprazole  (PROTONIX ) 40 MG tablet Take 1 tablet (40 mg total) by mouth daily. 04/28/20  Yes Sagardia, Isidro Margo, MD  Ascorbic Acid (VITAMIN C PO) Take by mouth.    [provider]  baclofen (LIORESAL) 10 MG tablet Take 1 tablet by mouth at bedtime.    [provider]  CALCIUM PO Take by mouth. Patient not taking: Reported on 03/31/2024    [provider]  diclofenac  (VOLTAREN ) 50 MG EC tablet TAKE 1 TABLET BY MOUTH TWICE DAILY AS NEEDED FOR PAIN WITH FOOD    [provider]  fluconazole  (DIFLUCAN ) 150 MG tablet Take 1 tablet (150 mg total) by mouth every 3 (three) days. Patient not taking: Reported on 03/31/2024 12/28/22    Andee Bamberger, NP    Current Outpatient Medications  Medication Sig Dispense Refill   Acetaminophen (TYLENOL PO) Take by mouth.     Multiple Vitamin (MULTIVITAMIN) tablet Take 1 tablet by mouth daily.     pantoprazole  (PROTONIX ) 40 MG tablet Take 1 tablet (40 mg total) by mouth daily. 30 tablet 3   Ascorbic Acid (VITAMIN C PO) Take by mouth.     baclofen (LIORESAL) 10 MG tablet Take 1 tablet by mouth at bedtime.     CALCIUM PO Take by mouth. (Patient not taking: Reported on 03/31/2024)     diclofenac  (VOLTAREN ) 50 MG EC tablet TAKE 1 TABLET BY MOUTH TWICE DAILY AS NEEDED FOR PAIN WITH FOOD     fluconazole  (DIFLUCAN ) 150 MG tablet Take 1 tablet (150 mg total) by mouth every 3 (three) days. (Patient not taking: Reported on 03/31/2024) 2 tablet 0   Current Facility-Administered Medications  Medication Dose Route Frequency Provider Last Rate Last Admin   0.9 %  sodium chloride  infusion  500 mL Intravenous Once Doral Ventrella V, MD        Allergies as of 03/31/2024   (No Known Allergies)    Family History  Problem Relation Age of Onset   Hypertension Mother    Stomach cancer Maternal Aunt    Colon cancer Neg Hx    Colon polyps Neg Hx    Esophageal cancer Neg Hx  Rectal cancer Neg Hx     Social History   Socioeconomic History   Marital status: Married    Spouse name: Not on file   Number of children: 5   Years of education: Not on file   Highest education level: Not on file  Occupational History   Not on file  Tobacco Use   Smoking status: Never   Smokeless tobacco: Never  Vaping Use   Vaping status: Never Used  Substance and Sexual Activity   Alcohol use: No   Drug use: No   Sexual activity: Yes    Partners: Male    Birth control/protection: Surgical, Condom, Post-menopausal    Comment: BTL, First IC >45 y/o, <5 Partners  Other Topics Concern   Not on file  Social History Narrative   Not on file   Social Drivers of Health   Financial Resource Strain:  Not on file  Food Insecurity: Not on file  Transportation Needs: Not on file  Physical Activity: Not on file  Stress: Not on file  Social Connections: Unknown (02/16/2022)   Received from [facility]   Social Network    Social Network: Not on file  Intimate Partner Violence: Unknown (01/31/2022)   Received from [facility]   HITS    Physically Hurt: Not on file    Insult or Talk Down To: Not on file    Threaten Physical Harm: Not on file    Scream or Curse: Not on file    Review of Systems:  All other review of systems negative except as mentioned in the HPI.  Physical Exam: Vital signs in last 24 hours: BP 128/76   Pulse 67   Temp 98.4 F (36.9 C) (Temporal)   Ht 5' (1.524 m)   Wt 211 lb (95.7 kg)   LMP 03/25/2018   SpO2 97%   BMI 41.21 kg/m  General:   Alert, NAD Lungs:  Clear .   Heart:  Regular rate and rhythm Abdomen:  Soft, nontender and nondistended. Neuro/Psych:  Alert and cooperative. Normal mood and affect. A and O x 3  Reviewed labs, radiology imaging, old records and pertinent past GI work up  Patient is appropriate for planned procedure(s) and anesthesia in an ambulatory setting   K. Veena Jadaya Sommerfield , MD [phone]

## 2024-03-31 NOTE — Progress Notes (Signed)
 Spanish interpreter, Marlen, used throughout the entire admission process.

## 2024-03-31 NOTE — Progress Notes (Signed)
 Patient stated that she ate rice and eggs at 0600 yesterday and rice and spaghetti at 1600 last evening.  She completed her entire prep and her stools are light liquid.  Spoke with Lavonia Powers, CRNA who will notify Dr. Nandigam when she arrives this morning; proceeded with the admission via the Spanish interpreter.

## 2024-03-31 NOTE — Patient Instructions (Signed)
 -Informacion sobre polipos, hemorroides y diverticulosis brindada  STED TUVO UN PROCEDIMIENTO ENDOSCPICO HOY EN EL [facility]:   Lea el informe del procedimiento que se le entreg para cualquier pregunta especfica sobre lo que se Dentist.  Si el informe del examen no responde a sus preguntas, por favor llame a su gastroenterlogo para aclararlo.  Si usted solicit que no se le den [employer] de lo que se Clinical cytogeneticist en su procedimiento al [employer] va a cuidar, entonces el informe del procedimiento se ha incluido en un sobre sellado para que usted lo revise despus cuando le sea ms conveniente.   LO QUE PUEDE ESPERAR: Algunas sensaciones de hinchazn en el abdomen.  Puede tener ms gases de lo normal.  El caminar puede ayudarle a eliminar el aire que se le puso en el tracto gastrointestinal durante el procedimiento y reducir la hinchazn.  Si le hicieron una endoscopia inferior (como una colonoscopia o una sigmoidoscopia flexible), podra notar manchas de sangre en las heces fecales o en el papel higinico.  Si se someti a una preparacin intestinal para su procedimiento, es posible que no tenga una evacuacin intestinal normal durante [employer].   Tenga en cuenta:  Es posible que note un poco de irritacin y congestin en la nariz o algn drenaje.  Esto es debido al oxgeno [employer] durante su procedimiento.  No hay que preocuparse y esto debe desaparecer ms o Regulatory affairs officer.   SNTOMAS PARA REPORTAR INMEDIATAMENTE:  Despus de una endoscopia inferior (colonoscopia o sigmoidoscopia flexible):  Cantidades excesivas de sangre en las heces fecales  Sensibilidad significativa o empeoramiento de los dolores abdominales   Hinchazn aguda del abdomen que antes no tena   Fiebre de 100F o ms    Para asuntos urgentes o de Associate Professor, puede comunicarse con un gastroenterlogo a cualquier hora llamando al [phone].  DIETA:  Recomendamos una comida  pequea al principio, pero luego puede continuar con su dieta normal.  Tome muchos lquidos, pero debe evitar las bebidas alcohlicas durante 24 horas.    ACTIVIDAD:  Debe planear tomarse las cosas con calma por el resto del da y no debe CONDUCIR ni usar maquinaria pesada Patent examiner (debido a los medicamentos de sedacin utilizados durante el examen).     SEGUIMIENTO: Nuestro personal llamar al nmero que aparece en su historial al siguiente da hbil de su procedimiento para ver cmo se siente y para responder cualquier pregunta o inquietud que pueda tener con respecto a la informacin que se le dio despus del procedimiento. Si no podemos contactarle, le dejaremos un mensaje.  Sin embargo, si se siente bien y no tiene English as a second language teacher, no es necesario que nos devuelva la llamada.  Asumiremos que ha regresado a sus actividades diarias normales sin incidentes. Si se le tomaron algunas biopsias, le contactaremos por telfono o por carta en las prximas 3 semanas.  Si no ha sabido [employer] biopsias en el transcurso de 3 semanas, por favor llmenos al [phone].   FIRMAS/CONFIDENCIALIDAD: Usted y/o el acompaante que le cuide han firmado documentos que se ingresarn en su historial mdico electrnico.  Estas firmas atestiguan el hecho de que la informacin anterior   Diverticulosis Diverticulosis  La diverticulosis se produce cuando se forman unas pequeas bolsas llamadas divertculos en la pared del colon. El colon es el lugar donde se absorbe el agua. Tambin es Immunologist donde se forma la materia fecal (heces). Las bolsas se forman cuando  la capa interna del colon ejerce presin sobre los puntos dbiles de las capas externas. Puede tener unas pocas bolsas o muchas. En la [employer], estas bolsas no causan problemas. Si se inflaman o infectan, usted puede padecer una enfermedad denominada diverticulitis. Cules son las causas? Se desconoce la causa de esta afeccin. Qu  incrementa el riesgo? Es ms probable que tenga esta afeccin si: Es mayor de 60 aos. No consume suficiente fibra o sufre estreimiento con mucha frecuencia. Tiene sobrepeso. No realiza suficiente actividad fsica. Fuma. Toma analgsicos sin receta. Tiene antecedentes familiares de la afeccin. Cules son los signos o sntomas? La mayora de las personas no tiene sntomas. Si tiene sntomas, pueden incluir los siguientes: Meteorismo. Clicos intestinales. Estreimiento o diarrea. Dolor en la parte inferior izquierda del abdomen. Cmo se diagnostica? Con frecuencia, esta afeccin se diagnostica durante un examen para detectar otros problemas del colon. Puede diagnosticarse cuando se realiza: Una colonoscopa. Este es un estudio en el que se usa  un tubo con una cmara en el extremo para observar el colon. Un enema de bario. Este es un examen radiogrfico en el que se usa  un tinte para observar el colon. Una exploracin por tomografa computarizada (TC). Cmo se trata? Puede que no necesite tratamiento. Su mdico le dir Programmer, systems en casa para Physiological scientist. Es posible que necesite tratamiento si tiene sntomas o si ha tenido diverticulitis antes. Es posible que le indiquen lo siguiente: [place] alimentos ricos Lobbyist. Tomar medicamentos para relajar el colon. Adelgazar. Siga estas instrucciones en su casa: Medicamentos Use los medicamentos de venta libre y los recetados solamente como se lo haya indicado el mdico. Si se lo indican, tome un suplemento con fibras o probitico. Control del estreimiento Su afeccin puede causar estreimiento. Para prevenir o tratar el estreimiento, es posible que deba hacer lo siguiente: Beber suficiente lquido para Radio producer pis (orina) de color amarillo plido. Usar medicamentos recetados o de Sales promotion account executive. Consumir alimentos ricos en fibra, como frijoles, cereales integrales, y frutas y verduras frescas. Limitar el consumo de  alimentos ricos en grasa y azcares procesados, como los alimentos fritos o dulces. Trate de no hacer fuerza cuando defeque. Comunquese con un mdico si: Los sntomas empeoran de [place] repentina. Tiene dolor abdominal que empeora. Tiene hinchazn o clicos estomacales. Sigue teniendo estreimiento con frecuencia. Tiene fiebre o escalofros. Vomita. Sus heces tienen sangre, aspecto alquitranado o son de color negro. Esta informacin no tiene Theme park manager el consejo del mdico. Asegrese de hacerle al mdico cualquier pregunta que tenga. Document Revised: 08/09/2022 Document Reviewed: 08/09/2022 Elsevier Patient Education  2024 Elsevier Inc.  Hemorroides Hemorrhoids Las hemorroides son venas hinchadas en el recto o la zona que lo rodea, o en la abertura de las nalgas (ano). Hay dos tipos de hemorroides: Internas. Se forman en las venas del interior del recto. Pueden abultarse hacia afuera, irritarse y doler. Externas. Estas se producen en las venas que estn fuera del ano. Pueden sentirse como una hinchazn o un bulto duro dolorosos cerca del ano. La mayora de las hemorroides no causan problemas graves. A menudo, pueden tratarse en casa con cambios en la dieta y el estilo de vida. Si los tratamientos caseros no ayudan, quizs necesite un procedimiento para reducir o extirpar las hemorroides. Cules son las causas? Las hemorroides son causadas por la presin cerca del ano. Esta presin puede deberse a lo siguiente: Estreimiento o diarrea. Hace esfuerzo para eliminar heces. Embarazo. Obesidad. Estar sentado o andar en  bicicleta durante mucho tiempo. Levantar objetos pesados u otras cosas que impliquen esfuerzo. Sexo anal. Cules son los signos o sntomas? Los sntomas de esta afeccin incluyen: Engineer, mining. Picazn o irritacin anal. Sangrado que proviene del recto. Fuga de materia fecal (heces). Hinchazn del ano. Uno o ms bultos alrededor del ano. Cmo se  diagnostica? Las hemorroides se diagnostican frecuentemente a travs de un examen visual. Posiblemente le realicen otros tipos de pruebas o estudios, como los siguientes: Psychiatrist. Esto lo realiza el mdico mediante tacto rectal con un dedo enguantado. Anoscopia. Este es un examen del ano que se hace con un tubo pequeo. Anlisis de sangre si ha perdido [employer]. Sigmoidoscopa o colonoscopa. Estos son estudios para observar el interior del colon a travs de un tubo que tiene una cmara en el extremo. Cmo se trata? En la [employer], las hemorroides se pueden tratar en casa con cambios en la dieta y el estilo de vida. Si estos cambios no resultan eficaces, tal vez deba someterse a un procedimiento. Estos procedimientos pueden reducir las hemorroides o extirparlas completamente. Algunos de los procedimientos ms frecuentes son los siguientes: Ligadura con Curator. Las bandas elsticas se colocan en la base de las hemorroides para interrumpir su irrigacin de [place]. Escleroterapia. Se pone un medicamento dentro de las hemorroides para reducir su tamao. Coagulacin con luz infrarroja. Se utiliza un tipo de energa lumnica para eliminar las hemorroides. Hemorroidectoma. Las hemorroides se extirpan durante la [place]. Luego, las venas que las irrigan se atan para Geologist, engineering. Hemorroidopexia con grapas. La base de las hemorroides se engrapa a la pared del recto. Siga estas instrucciones en su casa: Medicamentos Use los medicamentos de venta libre y los recetados solamente como se lo haya indicado el mdico. Use cremas medicadas o medicamentos medicinales que se ponen en el recto (supositorios) como se lo haya indicado el mdico. Comida y bebida  Consumir alimentos ricos en fibra, como frijoles, cereales integrales, y frutas y verduras frescas. Pregntele a su mdico acerca de tomar productos con fibra aadida (suplementos de [place]). Disminuya la cantidad de grasa  de la dieta. Esto se puede lograr consumiendo productos lcteos con bajo contenido de grasas, ingiriendo menor cantidad de carnes rojas y evitando los alimentos procesados. Beber suficiente lquido para Radio producer pis (orina) de color amarillo plido. Control del dolor y la hinchazn  Tome baos de asiento tibios durante 20 minutos, 3 a 4 veces por Futures trader. Esto puede ayudar a Glass blower/designer y Environmental health practitioner. Puede hacer esto en una baera o usar un dispositivo porttil para bao de asiento que se coloca sobre el inodoro. Si se lo indican, aplique hielo en la zona afectada. El uso de compresas de hielo entre baos de asiento puede ser de ayuda. Ponga el hielo en una bolsa plstica. Coloque una toalla entre la piel y la bolsa. Aplique el hielo durante 20 minutos, 2 a 3 veces por da. Si la piel se le pone de color rojo brillante, retire el hielo de inmediato para evitar daos en la piel. El [place] de dao es mayor si no puede sentir dolor, Airline pilot o fro. Instrucciones generales Actividad fsica. Consulte al mdico qu cantidad y qu tipo de ejercicio es mejor para usted. En general, debe realizar al menos 30 minutos de ejercicio moderado la [employer] de la semana (150 minutos cada semana). Es recomendable que pruebe con caminar, andar en bicicleta o practicar yoga. Vaya al bao cuando sienta ganas de defecar. No  espere. Evite hacer esfuerzos para defecar. Mantenga el ano limpio y seco. Use papel higinico hmedo o toallitas humedecidas despus de defecar. No pase mucho tiempo sentado en el inodoro. Esto puede aumentar la afluencia de sangre y Chief Technology Officer. Dnde buscar ms informacin [employer] of Diabetes and Digestive and Kidney Diseases [employer] de la Diabetes y las Enfermedades Digestivas y Renales): [employer].si Comunquese con un mdico si: Tiene ms dolor e hinchazn que no mejoran con Scientist, research (medical). Tiene dificultad para defecar o no puede hacerlo. Siente dolor o  tiene inflamacin fuera de la zona de las hemorroides. Solicite ayuda de inmediato si: Tiene sangrado del recto y no puede detenerlo. Esta informacin no tiene Theme park manager el consejo del mdico. Asegrese de hacerle al mdico cualquier pregunta que tenga. Document Revised: 07/20/2022 Document Reviewed: 07/20/2022 Elsevier Patient Education  2024 [employer].

## 2024-03-31 NOTE — Progress Notes (Unsigned)
 Vss nad trans to pacu

## 2024-03-31 NOTE — Op Note (Signed)
  [facility] Patient Name: Isabel Deleon Procedure Date: 03/31/2024 8:24 AM MRN: [id number] Endoscopist: Sergio Dandy , MD, [id number] Age: 58 Referring MD:  Date of Birth: [birth year] Gender: Female Account #: [id number] Procedure:                Colonoscopy Indications:              High risk colon cancer surveillance: Personal                            history of multiple (3 or more) adenomas Medicines:                Monitored Anesthesia Care Procedure:                Pre-Anesthesia Assessment:                           - Prior to the procedure, a History and Physical                            was performed, and patient medications and                            allergies were reviewed. The patient's tolerance of                            previous anesthesia was also reviewed. The risks                            and benefits of the procedure and the sedation                            options and risks were discussed with the patient.                            All questions were answered, and informed consent                            was obtained. Prior Anticoagulants: The patient has                            taken no anticoagulant or antiplatelet agents. ASA                            Grade Assessment: II - A patient with mild systemic                            disease. After reviewing the risks and benefits,                            the patient was deemed in satisfactory condition to                            undergo the procedure.  After obtaining informed consent, the colonoscope                            was passed under direct vision. Throughout the                            procedure, the patient's blood pressure, pulse, and                            oxygen saturations were monitored continuously. The                            PCF-HQ190L Colonoscope [id number] was introduced                            through the  anus and advanced to the the cecum,                            identified by appendiceal orifice and ileocecal                            valve. The colonoscopy was performed without                            difficulty. The patient tolerated the procedure                            well. The quality of the bowel preparation was                            good. The ileocecal valve, appendiceal orifice, and                            rectum were photographed. Scope In: 8:40:37 AM Scope Out: 8:56:58 AM Scope Withdrawal Time: 0 hours 10 minutes 25 seconds  Total Procedure Duration: 0 hours 16 minutes 21 seconds  Findings:                 The perianal and digital rectal examinations were                            normal.                           Three sessile polyps were found in the transverse                            colon. The polyps were 4 to 8 mm in size. These                            polyps were removed with a cold snare. Resection                            and retrieval were complete.  Scattered small-mouthed diverticula were found in                            the sigmoid colon and descending colon.                           Non-bleeding external and internal hemorrhoids were                            found during retroflexion. The hemorrhoids were                            medium-sized. Complications:            No immediate complications. Estimated Blood Loss:     Estimated blood loss was minimal. Impression:               - Three 4 to 8 mm polyps in the transverse colon,                            removed with a cold snare. Resected and retrieved.                           - Diverticulosis in the sigmoid colon and in the                            descending colon.                           - Non-bleeding external and internal hemorrhoids. Recommendation:           - Patient has a contact number available for                             emergencies. The signs and symptoms of potential                            delayed complications were discussed with the                            patient. Return to normal activities tomorrow.                            Written discharge instructions were provided to the                            patient.                           - Resume previous diet.                           - Continue present medications.                           - Repeat colonoscopy in 3 - 5 years for  surveillance based on pathology results.                           - Await pathology results. Treasure Ochs V. Iyesha Such, MD 03/31/2024 9:05:07 AM This report has been signed electronically.

## 2024-03-31 NOTE — Progress Notes (Signed)
 Called to room to assist during endoscopic procedure.  Patient ID and intended procedure confirmed with present staff. Received instructions for my participation in the procedure from the performing physician.

## 2024-04-01 ENCOUNTER — Telehealth: Payer: Self-pay | Admitting: *Deleted

## 2024-04-01 ENCOUNTER — Encounter: Payer: Self-pay | Admitting: Gastroenterology

## 2024-04-01 NOTE — Telephone Encounter (Signed)
  Follow up Call-     03/31/2024    7:30 AM  Call back number  Post procedure Call Back phone  # [phone]  Permission to leave phone message Yes     Patient questions:  Do you have a fever, pain , or abdominal swelling? No. Pain Score  0 *  Have you tolerated food without any problems? Yes.    Have you been able to return to your normal activities? Yes.    Do you have any questions about your discharge instructions: Diet   No. Medications  No. Follow up visit  No.  Do you have questions or concerns about your Care? No.  Actions: * If pain score is 4 or above: No action needed, pain <4.

## 2024-04-02 LAB — SURGICAL PATHOLOGY

## 2024-05-16 ENCOUNTER — Ambulatory Visit: Payer: Self-pay | Admitting: Gastroenterology

## 2024-11-28 ENCOUNTER — Ambulatory Visit: Admitting: Gastroenterology
